# Patient Record
Sex: Female | Born: 1985 | Race: White | Hispanic: No | Marital: Married | Smoking: Never smoker
Health system: Southern US, Community
[De-identification: ages and names within clinical notes are randomized; demographics above are authoritative.]

## PROBLEM LIST (undated history)

## (undated) DIAGNOSIS — F419 Anxiety disorder, unspecified: Secondary | ICD-10-CM

## (undated) DIAGNOSIS — R011 Cardiac murmur, unspecified: Secondary | ICD-10-CM

## (undated) DIAGNOSIS — D759 Disease of blood and blood-forming organs, unspecified: Secondary | ICD-10-CM

## (undated) DIAGNOSIS — K219 Gastro-esophageal reflux disease without esophagitis: Secondary | ICD-10-CM

## (undated) DIAGNOSIS — O1410 Severe pre-eclampsia, unspecified trimester: Secondary | ICD-10-CM

## (undated) DIAGNOSIS — N649 Disorder of breast, unspecified: Secondary | ICD-10-CM

## (undated) DIAGNOSIS — M797 Fibromyalgia: Secondary | ICD-10-CM

## (undated) DIAGNOSIS — O99019 Anemia complicating pregnancy, unspecified trimester: Secondary | ICD-10-CM

## (undated) HISTORY — PX: BREAST SURGERY: SHX581

---

## 2012-09-28 ENCOUNTER — Other Ambulatory Visit: Payer: Self-pay

## 2012-10-06 ENCOUNTER — Ambulatory Visit (HOSPITAL_COMMUNITY)
Admission: RE | Admit: 2012-10-06 | Discharge: 2012-10-06 | Disposition: A | Discharge status: Home / Self Care | Payer: BC Managed Care – PPO | Source: Ambulatory Visit | Attending: Obstetrics and Gynecology | Admitting: Obstetrics and Gynecology

## 2012-10-06 ENCOUNTER — Other Ambulatory Visit (HOSPITAL_COMMUNITY): Payer: Self-pay | Admitting: Obstetrics and Gynecology

## 2012-10-06 ENCOUNTER — Ambulatory Visit (HOSPITAL_COMMUNITY)
Admission: RE | Admit: 2012-10-06 | Discharge: 2012-10-06 | Disposition: A | Discharge status: Home / Self Care | Payer: BC Managed Care – PPO | Source: Ambulatory Visit

## 2012-10-06 ENCOUNTER — Other Ambulatory Visit (HOSPITAL_COMMUNITY): Payer: Self-pay | Admitting: Obstetrics & Gynecology

## 2012-10-06 ENCOUNTER — Ambulatory Visit (HOSPITAL_COMMUNITY): Payer: BC Managed Care – PPO

## 2012-10-06 ENCOUNTER — Other Ambulatory Visit: Payer: Self-pay

## 2012-10-06 ENCOUNTER — Encounter (HOSPITAL_COMMUNITY): Payer: Self-pay

## 2012-10-06 DIAGNOSIS — O2690 Pregnancy related conditions, unspecified, unspecified trimester: Secondary | ICD-10-CM

## 2012-10-06 DIAGNOSIS — O289 Unspecified abnormal findings on antenatal screening of mother: Secondary | ICD-10-CM | POA: Insufficient documentation

## 2012-10-06 DIAGNOSIS — O30009 Twin pregnancy, unspecified number of placenta and unspecified number of amniotic sacs, unspecified trimester: Secondary | ICD-10-CM | POA: Insufficient documentation

## 2012-10-06 DIAGNOSIS — O358XX0 Maternal care for other (suspected) fetal abnormality and damage, not applicable or unspecified: Secondary | ICD-10-CM | POA: Insufficient documentation

## 2012-10-06 DIAGNOSIS — O30049 Twin pregnancy, dichorionic/diamniotic, unspecified trimester: Secondary | ICD-10-CM | POA: Insufficient documentation

## 2012-10-06 NOTE — Progress Notes (Signed)
Genetic Counseling  Visit Summary Note  Appointment Date: 10/06/2012 Referred By: Earl Gala, CNM  Date of Birth: June 09, 1986  Pregnancy history: G1P0000 Estimated Date of Delivery: 04/13/13 Estimated Gestational Age: [redacted]w[redacted]d  I met with Sandra Tran and her husband, Sandra Tran, for genetic counseling because of an increased risk for fetal Down syndrome and trisomies 13/18 based on First trimester screening through [facility].  Mr. Zarling mother and sister attended the genetic counseling appointment today.  We reviewed Sandra Tran's First trimester screening result and the associated risks.  They were counseled that the screening result for Twin A was positive for Down syndrome (1 in 50 risk) and trisomies 13/18 (1 in 22); the screening result was within normal limits (<1 in 10,000 risk for Down syndrome and trisomies 13/18) for twin B.  We reviewed chromosomes, nondisjunction, and the common features and variable prognosis of Down syndrome and the associated poor prognosis of trisomies 13/18.  They were counseled that the screening result is calculated based on both biochemical and ultrasound markers.  While it is difficult to determine how much of each biochemical substance (protein) is produced by each baby, the ultrasound findings are specific to each fetus, thus a specific risk for aneuploidy can be provided for twin A and twin B.  We discussed that the nuchal translucency (NT) for twin A measured 4.6 mm on the date of screening, which is greater than the 99th percentile for gestational age.  In addition, a fetal nasal bone was not able to be assessed at that time.  We discussed that not being able to assess the nasal bone means that it's presence or absence is not calculated into the risk equation.  This is different than the finding of an absent nasal bone, which significantly increases the likelihood of fetal aneuploidy.  They understand that First trimester screening is not  diagnostic for fetal aneuploidy but provides a pregnancy specific risk assessment.  We discussed that the fetal NT refers to a fluid filled space between the skin and soft tissues behind the cervical spine.  This space is traditionally measurable between 11 and 13.[redacted] weeks gestation and is considered enlarged when greater than the 95th percentile for gestational age.  This couple was counseled regarding the various common etiologies for an enlarged NT including: aneuploidy, single gene conditions, cardiac or great vessel abnormalities, lymphatic system failure, decreased fetal movement, and fetal anemia. They understand that the risks for aneuploidy calculated by First trimester screening incorporate the patient's age related risk(s), the size of each fetal NT, nasal bone, and biochemistry.  Regarding risks for a congenital heart defect, we discussed that an NT of 4.6 mm is associated with an approximate 7% risk for a fetal heart defect.  We also discussed single gene conditions and that these conditions are not routinely tested for prenatally unless ultrasound findings or family history significantly increase the suspicion of a specific single gene disorder.  We briefly reviewed common inheritance patterns (dominant, recessive, and X-linked) as well as the associated risks of recurrence.     They were then counseled regarding their options of noninvasive prenatal testing (NIPT), 2nd trimester detailed ultrasound, fetal echocardiogram, CVS and amniocentesis.   We reviewed the benefits, limitations, and risks of these options.  After thoughtful consideration, they requested an ultrasound today to reassess the fetal NT measurements.  A limited ultrasound was performed.  The report will be documented separately.  Twin A had an NT of 2.1 mm with a present  nasal bone.  Twin B had an NT of 2.7 mm with a present nasal bone.  We discussed that although the measurements obtained today differed from those obtained a week  ago, it is not advisable to modify the First trimester screening results.  We discussed that this is because it is possible that the NT measurement for twin A is less because it is in the process of resolving.  In addition, we cannot guarantee that we are labeling Twin A and Twin B the same as prior ultrasounds at the referring physician's office.  They were counseled that NT measurements typically increase with CRL, thus some change is expected in a week.  They elected to proceed with NIPT.  We reviewed that NIPT analyzes cell free fetal DNA found in the maternal circulation. This test is not diagnostic for chromosome conditions, but can provide information regarding the presence or absence of extra fetal DNA for chromosomes 13, 18, 21, X, and Y, and missing fetal DNA for chromosome X (Turner syndrome) and Y. Thus, it would not identify or rule out all genetic conditions. The reported detection rate is greater than 99% for Trisomy 21, greater than 98% for Trisomy 18, and is approximately 80% (8 out of 10) for Trisomy 13. The false positive rate is reported to be less than 0.1% for any of these conditions. We discussed that X and Y chromosome analysis is not available for a twin pregnancy.  They understand that this testing does not provide a definitive diagnosis; however, they wish to pursue NIPT in order to further personalize the risk for aneuploidy and will consider amniocentesis pending these results.  They declined CVS today.  They also expressed interest in a detailed ultrasound at 18 weeks and fetal echocardiogram.  They were counseled that the fetal prognosis depends on the underlying etiology of the enlarged NT and further anticipatory guidance can be provided if a diagnosis is discovered.  They understand that the legal limit for TAB in [place] is [redacted] weeks gestation.  Both family histories were reviewed and found to be noncontributory for birth defects, mental retardation, and known genetic conditions.   Without further information regarding the provided family history, an accurate genetic risk cannot be calculated. Further genetic counseling is warranted if more information is obtained.  Mrs. Mireles denied exposure to environmental toxins and chemical agents. She denied the use of alcohol, tobacco or street drugs. She denied significant viral illnesses during the course of her pregnancy. Her medical and surgical histories were noncontributory.   I counseled this couple for approximately 60 minutes regarding the above risks and available options.     Donald Prose, MS Certified Genetic Counselor

## 2012-10-06 NOTE — Progress Notes (Signed)
Sandra Tran  was seen today for an ultrasound appointment.  See full report in [facility].  Comments: [name] was seen today due to thickened NT of 4 mm on twin A last week.  See separate note from Genetics couseling.  After counseling, the patient elected to undergo NIPT (H[facility]ony test).  In the event that cell free fetal DNA returns high risk for aneuploidy, the couple plans on amniocentesis at 15-16 weeks.  Impression: DC/DA twin gestation with best dates of 13 0/7 weeks A thick separating membrane was noted with a twin peak sign.  Twin A:  NT 2.1 mm, nasal bone visualized  Twin B: NT  2.7 mm, nasal bone visualized  Recommendations: See comments above. Recommend follow-up ultrasound examination at approximately 18 weeks for detailed anatomy.  Based on results of Harmony test and ultrasound findings at that time, may proceed with fetal echo due to thickened nuchal fold on previous ultrasound.  Alpha Gula, MD

## 2012-10-16 ENCOUNTER — Telehealth (HOSPITAL_COMMUNITY): Payer: Self-pay | Admitting: MS"

## 2012-10-16 NOTE — Telephone Encounter (Signed)
Called Sandra Tran to discuss her Harmony, cell free fetal DNA testing. Testing was offered because of screen positive Down syndrome, Trisomy 18 and 13 risks from first trimester screening. We reviewed that these are within normal limits, showing a less than 1 in 10,000 risk for trisomies 21, 18 and 13. We reviewed that this testing identifies > 99% of pregnancies with trisomy 21, >98% of pregnancies with trisomy 58, and >80% with trisomy 47; the false positive rate is <0.1% for all conditions. However, we reviewed that detection rate is lower in a twin pregnancy. Testing is not available for X and Y chromosome analysis in a twin gestation. She understands that this testing does not identify all genetic conditions. All questions were answered to her satisfaction, she was encouraged to call with additional questions or concerns.  Quinn Plowman, MS Certified Genetic Counselor 10/16/2012 3:18 PM

## 2012-11-11 ENCOUNTER — Other Ambulatory Visit (HOSPITAL_COMMUNITY): Payer: Self-pay | Admitting: Obstetrics and Gynecology

## 2012-11-11 DIAGNOSIS — O30009 Twin pregnancy, unspecified number of placenta and unspecified number of amniotic sacs, unspecified trimester: Secondary | ICD-10-CM

## 2012-11-13 ENCOUNTER — Ambulatory Visit (HOSPITAL_COMMUNITY)
Admission: RE | Admit: 2012-11-13 | Discharge: 2012-11-13 | Disposition: A | Discharge status: Home / Self Care | Payer: BC Managed Care – PPO | Source: Ambulatory Visit | Attending: Obstetrics and Gynecology | Admitting: Obstetrics and Gynecology

## 2012-11-13 ENCOUNTER — Encounter (HOSPITAL_COMMUNITY): Payer: Self-pay

## 2012-11-13 VITALS — BP 113/71 | HR 106 | Wt 182.0 lb

## 2012-11-13 DIAGNOSIS — O289 Unspecified abnormal findings on antenatal screening of mother: Secondary | ICD-10-CM

## 2012-11-13 DIAGNOSIS — Z3689 Encounter for other specified antenatal screening: Secondary | ICD-10-CM | POA: Insufficient documentation

## 2012-11-13 DIAGNOSIS — O30009 Twin pregnancy, unspecified number of placenta and unspecified number of amniotic sacs, unspecified trimester: Secondary | ICD-10-CM | POA: Insufficient documentation

## 2012-11-13 DIAGNOSIS — O30049 Twin pregnancy, dichorionic/diamniotic, unspecified trimester: Secondary | ICD-10-CM | POA: Insufficient documentation

## 2012-11-13 IMAGING — US US OB DETAIL+14 WK
1 series · 15 of 28 positions shown · non-contrast
Comparison: none

OBSTETRICS REPORT
                      (Signed Final [DATE] [DATE])

Service(s) Provided
 US OB DETAIL + 14 WK                                  76811.0
 US OB DETAIL ADDL GEST + 14 WK                        76811.1
Indications
 Twin gestation, Di-Di
Fetal Evaluation (Fetus A)
 Num Of Fetuses:    2
 Fetal Heart Rate:  153                          bpm
 Cardiac Activity:  Observed
 Fetal Lie:         Right Fetus
 Presentation:      Breech
 Placenta:          Posterior, above cervical
                    os
 P. Cord            Visualized, central
 Insertion:
 Membrane Desc:     Dividing
                    Membrane seen
                    - Dichorionic.
 Amniotic Fluid
 AFI FV:      Subjectively within normal limits
                                             Larg Pckt:     5.2  cm
Biometry (Fetus A)
 BPD:     42.8  mm     G. Age:  19w 0d                CI:         78.7   70 - 86
 OFD:     54.4  mm                                    FL/HC:      16.1   15.8 -
                                                                         18
 HC:     156.7  mm     G. Age:  18w 5d       50  %    HC/AC:      1.17   1.07 -
 AC:     134.4  mm     G. Age:  19w 0d       63  %    FL/BPD:
 FL:      25.2  mm     G. Age:  17w 5d       18  %    FL/AC:      18.8   20 - 24
 HUM:     26.1  mm     G. Age:  18w 1d       48  %
 CER:     18.9  mm     G. Age:  18w 3d       51  %
 NFT:        5  mm
 Est. FW:     236  gm      0 lb 8 oz     45  %     FW Discordancy      0 \ 6 %
Gestational Age (Fetus A)
 LMP:           18w 3d        Date:  [DATE]                 EDD:   [DATE]
 U/S Today:     18w 4d                                        EDD:   [DATE]
 Best:          18w 3d     Det. By:  LMP  ([DATE])          EDD:   [DATE]
Anatomy (Fetus A)
 Cranium:          Appears normal         Aortic Arch:      Appears normal
 Fetal Cavum:      Appears normal         Ductal Arch:      Appears normal
 Ventricles:       Appears normal         Diaphragm:        Appears normal
 Choroid Plexus:   Appears normal         Stomach:          Appears normal, left
                                                            sided
 Cerebellum:       Appears normal         Abdomen:          Appears normal
 Posterior Fossa:  Appears normal         Abdominal Wall:   Appears nml (cord
                                                            insert, abd wall)
 Nuchal Fold:      Appears normal         Cord Vessels:     Appears normal (3
                                                            vessel cord)
 Face:             Appears normal         Kidneys:          Appear normal
                   (orbits and profile)
 Lips:             Appears normal         Bladder:          Appears normal
 Palate:           Not well visualized    Spine:            Appears normal
 Heart:            Not well visualized    Lower             Appears normal
                                          Extremities:
 RVOT:             Appears normal         Upper             Appears normal
 LVOT:             Not well visualized
 Other:  Fetus appears to be a male. Heels and 5th digit appear normal.
Targeted Anatomy (Fetus A)
 Fetal Central Nervous System
 Cisterna Magna:
Fetal Evaluation (Fetus B)
 Fetal Heart Rate:  155                          bpm
 Fetal Lie:         Left Fetus
 Presentation:      Cephalic
 Placenta:          Anterior, above cervical os
                                             Larg Pckt:     5.1  cm
Biometry (Fetus B)
 BPD:     38.1  mm     G. Age:  17w 4d                CI:         71.8   70 - 86
 OFD:     53.1  mm                                    FL/HC:      17.8   15.8 -
 HC:     146.8  mm     G. Age:  17w 6d       17  %    HC/AC:      1.17   1.07 -
 AC:     125.8  mm     G. Age:  18w 1d       39  %    FL/BPD:
 FL:      26.2  mm     G. Age:  18w 0d       28  %    FL/AC:      20.8   20 - 24
 HUM:     25.1  mm     G. Age:  17w 6d       37  %
 Est. FW:     221  gm      0 lb 8 oz     38  %     FW Discordancy         6  %
Gestational Age (Fetus B)
 U/S Today:     17w 6d                                        EDD:   [DATE]
Anatomy (Fetus B)
 Fetal Cavum:      Not well visualized    Ductal Arch:      Not well visualized
 Cerebellum:       Not well visualized    Abdomen:          Appears normal
 Posterior Fossa:  Not well visualized    Abdominal Wall:   Appears nml (cord
 Nuchal Fold:      Not well visualized    Cord Vessels:     Appears normal (3
 Face:             Profile appears        Kidneys:          Appear normal
                   normal
 Lips:             Not well visualized    Bladder:          Appears normal
 Palate:           Not well visualized    Spine:            Not well visualized
 RVOT:             Not well visualized    Upper             Appears normal
 Other:  Fetus appears to be a female. Heels and 5th digit appear normal.
Cervix Uterus Adnexa
 Cervical Length:    3.7      cm
 Cervix:       Normal appearance by transabdominal scan.
 Left Ovary:    Within normal limits.
 Right Ovary:   Within normal limits.
Impression
INDICATION: 27 yr old G1P0 at [BX] with
 dichorionic/diamniotic twin gestation and previous finding of
 enlarged nuchal translucency in twin A for fetal anatomic
 survey.

[Series 1: us ob detail+14 wk · 0.21mm/px · 15 of 178 slices shown]
[im 1/178]
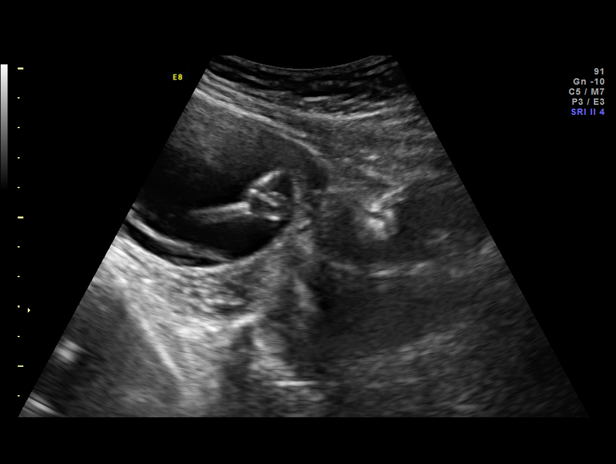
[im 14/178]
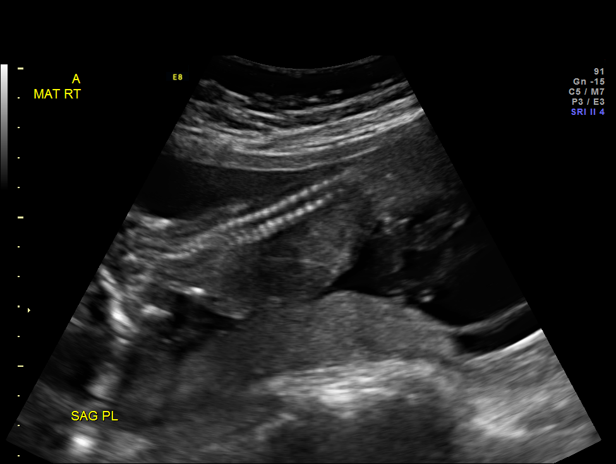
[im 27/178]
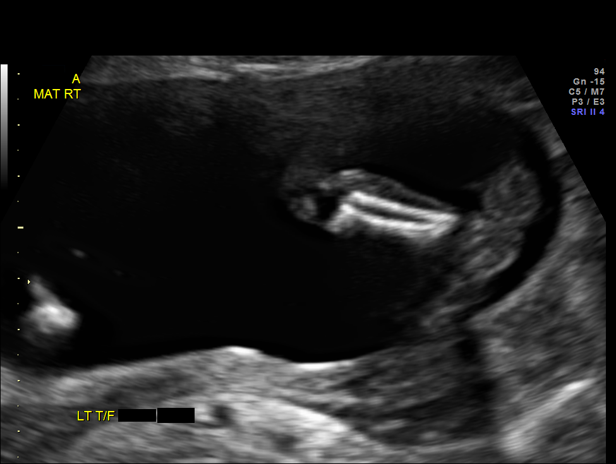
[im 40/178]
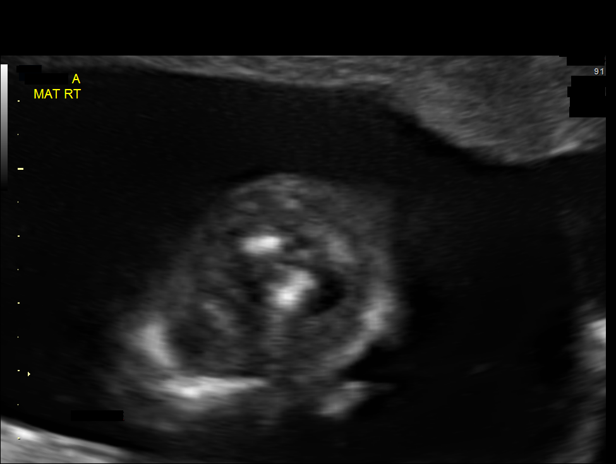
[im 53/178]
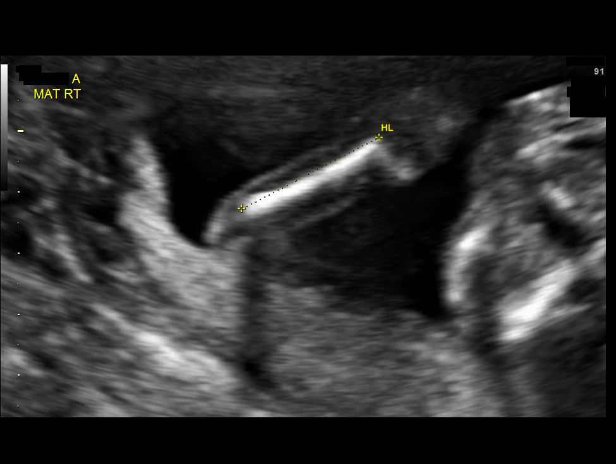
[im 66/178]
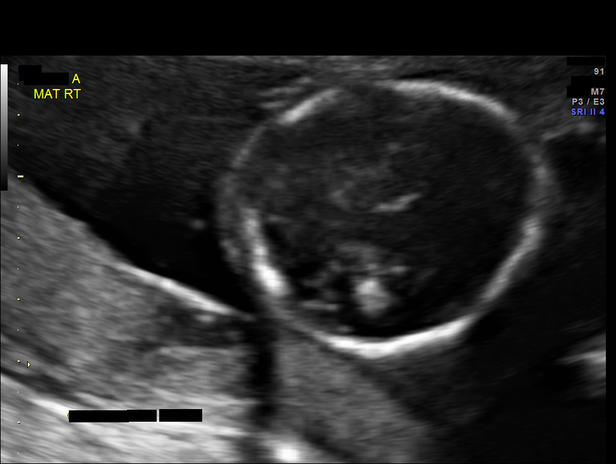
[im 79/178]
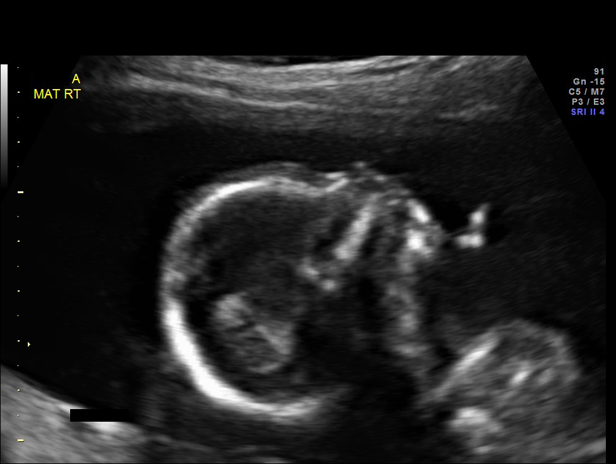
[im 92/178]
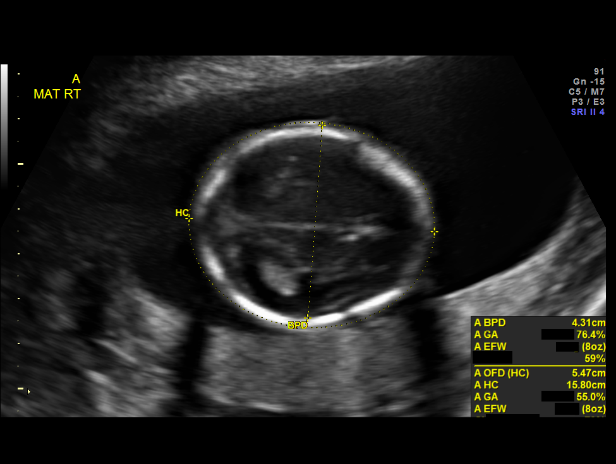
[im 99/178]
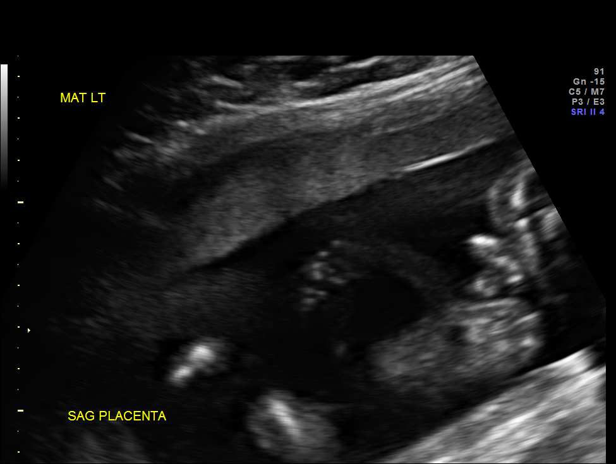
[im 112/178]
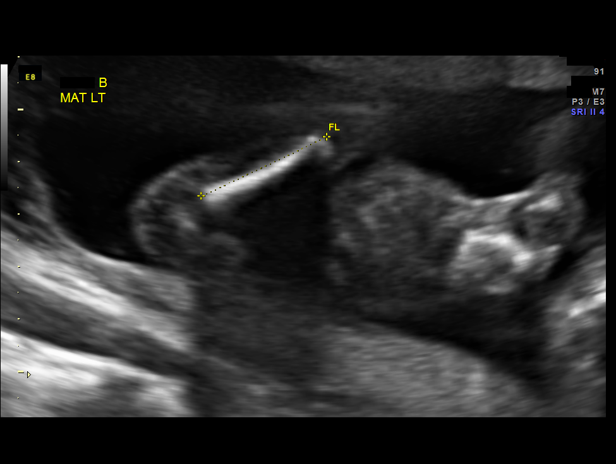
[im 125/178]
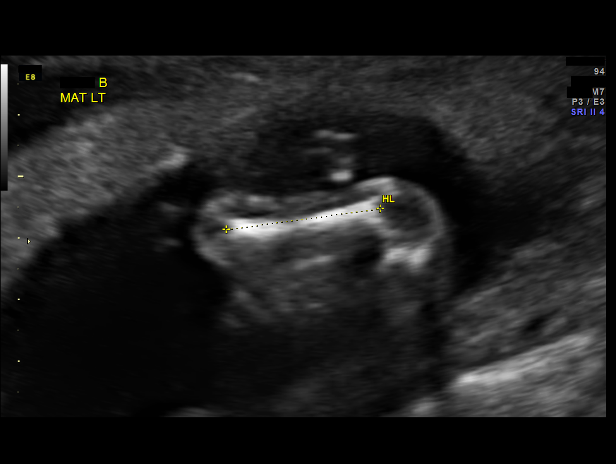
[im 138/178]
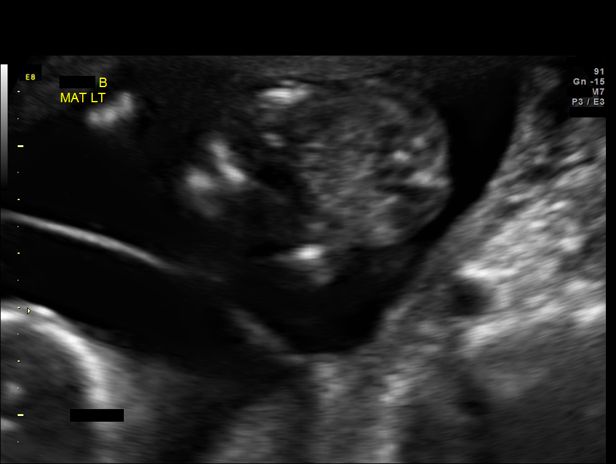
[im 151/178]
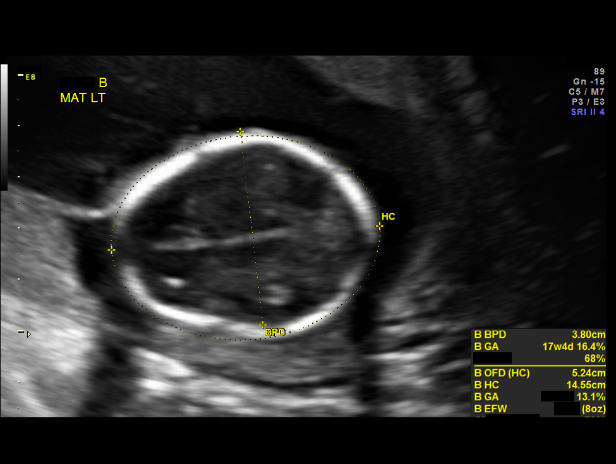
[im 164/178]
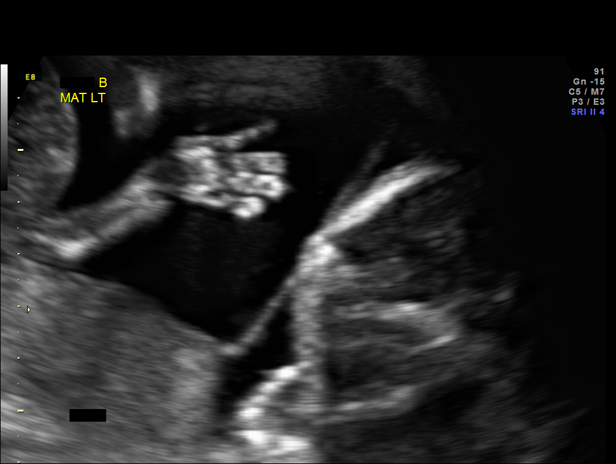
[im 178/178]
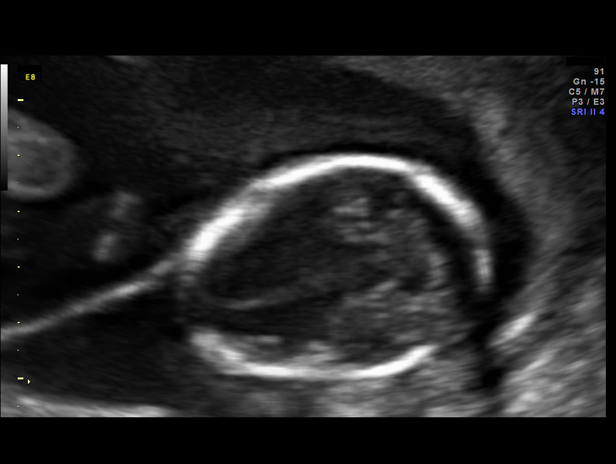

[15 of 28 positions shown; findings below may reference images not displayed]

FINDINGS: 1. Dichorionic/diamniotic twin gestation; the dividing
 membrane is seen.
 2. Fetal biometry for both fetuses is consistent with dating.
 3. Twin A with a posterior placenta; twin B with an anterior
 placenta; there is no evidence of previa.
 4. Normal amniotic fluid volume for both fetuses.
 5. Normal transabdominal cervical length.
 6. The views of the palate and heart are limited in twin A.
 7. The views of the posterior fossa, cavum, heart, orbits, and
 spine are limited in twin B.
 8. The remainder of the limited anatomy survey for both
 fetuses is normal.
Recommendations

 1. Twin pregnancy:
 - previously counseled
 - recommend fetal growth every 4 weeks
 - recommend delivery at 38 weeks or sooner if clinically
 indicated
 - recommend close surveillance for the development of
 signs/symptoms of preterm labor/PPROM
 2. Limited anatomy survey:
 - recommend follow up in 3 weeks for fetal growth and
 complete anatomic survey
 3. Previous finding of enlarged nuchal translucency in twin A
 with increased risk of aneuploidy on first trimester screen:
 - previously counseled
 - had normal Harm[facility] screen
 - recommend fetal echocardiogram at 22 weeks

## 2012-11-13 NOTE — Progress Notes (Signed)
[facility] ultrasound  Indication: 27 yr old G1P0 at [redacted]w[redacted]d with dichorionic/diamniotic twin gestation and previous finding of enlarged nuchal translucency in twin A for fetal anatomic survey.  Findings: 1. Dichorionic/diamniotic twin gestation; the dividing membrane is seen. 2. Fetal biometry for both fetuses is consistent with dating. 3. Twin A with a posterior placenta; twin B with an anterior placenta; there is no evidence of previa. 4. Normal amniotic fluid volume for both fetuses. 5. Normal transabdominal cervical length. 6. The views of the palate and heart are limited in twin A. 7. The views of the posterior fossa, cavum, heart, orbits, and spine are limited in twin B. 8. The remainder of the limited anatomy survey for both fetuses is normal.  Recommendations: 1. Twin pregnancy: - previously counseled - recommend fetal growth every 4 weeks - recommend delivery at 38 weeks or sooner if clinically indicated - recommend close surveillance for the development of signs/symptoms of preterm labor/PPROM 2. Limited anatomy survey: - recommend follow up in 3 weeks for fetal growth and complete anatomic survey 3. Previous finding of enlarged nuchal translucency in twin A with increased risk of aneuploidy on first trimester screen: - previously counseled - had normal Harmony screen - recommend fetal echocardiogram at 22 weeks  Eulis Foster, MD

## 2012-12-04 ENCOUNTER — Encounter: Payer: Self-pay | Admitting: Obstetrics and Gynecology

## 2012-12-04 ENCOUNTER — Ambulatory Visit (HOSPITAL_COMMUNITY)
Admission: RE | Admit: 2012-12-04 | Discharge: 2012-12-04 | Disposition: A | Discharge status: Home / Self Care | Payer: BC Managed Care – PPO | Source: Ambulatory Visit | Attending: Obstetrics and Gynecology | Admitting: Obstetrics and Gynecology

## 2012-12-04 VITALS — BP 125/74 | HR 110 | Wt 193.5 lb

## 2012-12-04 DIAGNOSIS — O289 Unspecified abnormal findings on antenatal screening of mother: Secondary | ICD-10-CM

## 2012-12-04 DIAGNOSIS — Z3689 Encounter for other specified antenatal screening: Secondary | ICD-10-CM | POA: Insufficient documentation

## 2012-12-04 DIAGNOSIS — O30049 Twin pregnancy, dichorionic/diamniotic, unspecified trimester: Secondary | ICD-10-CM

## 2012-12-04 DIAGNOSIS — O30009 Twin pregnancy, unspecified number of placenta and unspecified number of amniotic sacs, unspecified trimester: Secondary | ICD-10-CM | POA: Insufficient documentation

## 2012-12-04 NOTE — Progress Notes (Signed)
Sandra Tran  was seen today for an ultrasound appointment.  See full report in [facility].  Impression: DC/DA twin gestation with best dates of 21 3/7 weeks Iterval growth is appropriate and concordant.  Twin A: Cephalic, maternal right, posterior placenta Normal interval anatomy Normal amniotic fluid volume  Twin B: Breech, maternal left, anterior placenta Normal fetal anatomy.  Somewhat limited views of the fetal heart obtained Normal amniotic fluid volume  Patient has fetal echo scheduled this afternoon (due to thickend NT on previous visit).  NIPT - low risk for aneuploidy.  Recommendations: Recommend follow-up ultrasound examination in 4 weeks for interval growth  Alpha Gula, MD

## 2013-01-04 ENCOUNTER — Ambulatory Visit (HOSPITAL_COMMUNITY)
Admission: RE | Admit: 2013-01-04 | Discharge: 2013-01-04 | Disposition: A | Discharge status: Home / Self Care | Payer: BC Managed Care – PPO | Source: Ambulatory Visit | Attending: Maternal and Fetal Medicine | Admitting: Maternal and Fetal Medicine

## 2013-01-04 DIAGNOSIS — O30049 Twin pregnancy, dichorionic/diamniotic, unspecified trimester: Secondary | ICD-10-CM

## 2013-01-04 DIAGNOSIS — O289 Unspecified abnormal findings on antenatal screening of mother: Secondary | ICD-10-CM

## 2013-01-04 DIAGNOSIS — O30009 Twin pregnancy, unspecified number of placenta and unspecified number of amniotic sacs, unspecified trimester: Secondary | ICD-10-CM | POA: Insufficient documentation

## 2013-01-04 DIAGNOSIS — Z3689 Encounter for other specified antenatal screening: Secondary | ICD-10-CM | POA: Insufficient documentation

## 2013-01-04 NOTE — Progress Notes (Signed)
[facility] ultrasound  Indication: 27 yr old G1P0 at 12w6dd with dichorionic/diamniotic twin gestation and previous finding of enlarged nuchal translucency in twin A for follow up fetal growth.  Findings: 1. Dichorionic/diamniotic twin gestation; the dividing membrane is seen. 2. Estimated fetal weight for twin A is in the 64th%; twin B is in the 55th%; the fetuses are concordant. 3. Twin A with a posterior placenta; twin B with an anterior placenta; there is no evidence of previa. 4. Normal amniotic fluid volume for both fetuses. 5. The limited anatomy survey for both fetuses is normal.  Recommendations: 1. Twin pregnancy: - previously counseled - recommend fetal growth every 4 weeks - recommend delivery at 38 weeks or sooner if clinically indicated - recommend close surveillance for the development of signs/symptoms of preterm labor/PPROM 2.  Previous finding of enlarged nuchal translucency in twin A with increased risk of aneuploidy on first trimester screen: - previously counseled - had normal [facility] screen - had normal fetal echocardiograms  Eulis Foster, MD

## 2013-01-18 ENCOUNTER — Other Ambulatory Visit: Payer: Self-pay

## 2013-02-11 ENCOUNTER — Encounter (HOSPITAL_COMMUNITY): Payer: Self-pay | Admitting: General Practice

## 2013-02-11 ENCOUNTER — Inpatient Hospital Stay (HOSPITAL_COMMUNITY)
Admission: AD | Admit: 2013-02-11 | Discharge: 2013-02-17 | DRG: 650 | Disposition: A | Discharge status: Home / Self Care | Payer: BC Managed Care – PPO | Source: Ambulatory Visit | Attending: Obstetrics and Gynecology | Admitting: Obstetrics and Gynecology

## 2013-02-11 DIAGNOSIS — O1410 Severe pre-eclampsia, unspecified trimester: Secondary | ICD-10-CM | POA: Diagnosis present

## 2013-02-11 DIAGNOSIS — F411 Generalized anxiety disorder: Secondary | ICD-10-CM | POA: Diagnosis present

## 2013-02-11 DIAGNOSIS — O1413 Severe pre-eclampsia, third trimester: Secondary | ICD-10-CM

## 2013-02-11 DIAGNOSIS — O30049 Twin pregnancy, dichorionic/diamniotic, unspecified trimester: Secondary | ICD-10-CM | POA: Diagnosis present

## 2013-02-11 DIAGNOSIS — O99019 Anemia complicating pregnancy, unspecified trimester: Secondary | ICD-10-CM | POA: Diagnosis present

## 2013-02-11 DIAGNOSIS — O1414 Severe pre-eclampsia complicating childbirth: Principal | ICD-10-CM | POA: Diagnosis present

## 2013-02-11 DIAGNOSIS — D696 Thrombocytopenia, unspecified: Secondary | ICD-10-CM | POA: Diagnosis present

## 2013-02-11 DIAGNOSIS — O99344 Other mental disorders complicating childbirth: Secondary | ICD-10-CM | POA: Diagnosis present

## 2013-02-11 DIAGNOSIS — O99013 Anemia complicating pregnancy, third trimester: Secondary | ICD-10-CM

## 2013-02-11 DIAGNOSIS — D500 Iron deficiency anemia secondary to blood loss (chronic): Secondary | ICD-10-CM | POA: Diagnosis present

## 2013-02-11 DIAGNOSIS — D689 Coagulation defect, unspecified: Secondary | ICD-10-CM | POA: Diagnosis present

## 2013-02-11 DIAGNOSIS — O9902 Anemia complicating childbirth: Secondary | ICD-10-CM | POA: Diagnosis present

## 2013-02-11 DIAGNOSIS — O30009 Twin pregnancy, unspecified number of placenta and unspecified number of amniotic sacs, unspecified trimester: Secondary | ICD-10-CM | POA: Diagnosis present

## 2013-02-11 HISTORY — DX: Cardiac murmur, unspecified: R01.1

## 2013-02-11 HISTORY — DX: Anxiety disorder, unspecified: F41.9

## 2013-02-11 HISTORY — DX: Disorder of breast, unspecified: N64.9

## 2013-02-11 HISTORY — DX: Fibromyalgia: M79.7

## 2013-02-11 HISTORY — DX: Gastro-esophageal reflux disease without esophagitis: K21.9

## 2013-02-11 HISTORY — DX: Anemia complicating pregnancy, unspecified trimester: O99.019

## 2013-02-11 HISTORY — DX: Severe pre-eclampsia, unspecified trimester: O14.10

## 2013-02-11 HISTORY — DX: Disease of blood and blood-forming organs, unspecified: D75.9

## 2013-02-11 LAB — CBC
HCT: 27.8 % — ABNORMAL LOW (ref 36.0–46.0)
Hemoglobin: 8.7 g/dL — ABNORMAL LOW (ref 12.0–15.0)
MCH: 23.8 pg — ABNORMAL LOW (ref 26.0–34.0)
MCH: 23.4 pg — ABNORMAL LOW (ref 26.0–34.0)
MCV: 76 fL — ABNORMAL LOW (ref 78.0–100.0)
MCV: 76 fL — ABNORMAL LOW (ref 78.0–100.0)
Platelets: 69 10*3/uL — ABNORMAL LOW (ref 150–400)
RBC: 3.66 MIL/uL — ABNORMAL LOW (ref 3.87–5.11)
RBC: 3.67 MIL/uL — ABNORMAL LOW (ref 3.87–5.11)
WBC: 14 10*3/uL — ABNORMAL HIGH (ref 4.0–10.5)

## 2013-02-11 LAB — COMPREHENSIVE METABOLIC PANEL
ALT: 6 U/L (ref 0–35)
ALT: 7 U/L (ref 0–35)
AST: 15 U/L (ref 0–37)
Albumin: 2 g/dL — ABNORMAL LOW (ref 3.5–5.2)
Alkaline Phosphatase: 186 U/L — ABNORMAL HIGH (ref 39–117)
Alkaline Phosphatase: 199 U/L — ABNORMAL HIGH (ref 39–117)
CO2: 19 mEq/L (ref 19–32)
GFR calc Af Amer: 79 mL/min — ABNORMAL LOW (ref >90)
GFR calc non Af Amer: 68 mL/min — ABNORMAL LOW (ref >90)
Glucose, Bld: 154 mg/dL — ABNORMAL HIGH (ref 70–99)
Potassium: 3.7 mEq/L (ref 3.5–5.1)
Potassium: 3.7 mEq/L (ref 3.5–5.1)
Sodium: 135 mEq/L (ref 135–145)
Sodium: 134 mEq/L — ABNORMAL LOW (ref 135–145)
Total Protein: 5.5 g/dL — ABNORMAL LOW (ref 6.0–8.3)
Total Protein: 5.8 g/dL — ABNORMAL LOW (ref 6.0–8.3)

## 2013-02-11 MED ORDER — DULOXETINE HCL 60 MG PO CPEP
60.0000 mg | ORAL_CAPSULE | Freq: Every day | ORAL | Status: DC
  Administered 2013-02-11 – 2013-02-12 (×2): 60 mg via ORAL
  Filled 2013-02-11 (×3): qty 1

## 2013-02-11 MED ORDER — SODIUM CHLORIDE 0.9 % IJ SOLN
3.0000 mL | Freq: Two times a day (BID) | INTRAMUSCULAR | Status: DC
  Administered 2013-02-11 – 2013-02-12 (×3): 3 mL via INTRAVENOUS

## 2013-02-11 MED ORDER — BETAMETHASONE SOD PHOS & ACET 6 (3-3) MG/ML IJ SUSP
12.0000 mg | INTRAMUSCULAR | Status: AC
  Administered 2013-02-11 – 2013-02-12 (×2): 12 mg via INTRAMUSCULAR
  Filled 2013-02-11 (×2): qty 2

## 2013-02-11 MED ORDER — TRAZODONE HCL 50 MG PO TABS
50.0000 mg | ORAL_TABLET | Freq: Every day | ORAL | Status: DC
  Administered 2013-02-11 – 2013-02-12 (×2): 50 mg via ORAL
  Filled 2013-02-11 (×3): qty 1

## 2013-02-11 MED ORDER — PREGABALIN 50 MG PO CAPS
100.0000 mg | ORAL_CAPSULE | Freq: Every day | ORAL | Status: DC
  Administered 2013-02-11 – 2013-02-12 (×2): 100 mg via ORAL
  Filled 2013-02-11: qty 2
  Filled 2013-02-11 (×3): qty 1

## 2013-02-11 MED ORDER — PRENATAL MULTIVITAMIN CH
1.0000 | ORAL_TABLET | Freq: Every day | ORAL | Status: DC
  Filled 2013-02-11: qty 1

## 2013-02-11 MED ORDER — DOCUSATE SODIUM 100 MG PO CAPS
100.0000 mg | ORAL_CAPSULE | Freq: Every day | ORAL | Status: DC
  Filled 2013-02-11: qty 1

## 2013-02-11 MED ORDER — ZOLPIDEM TARTRATE 5 MG PO TABS: 5.0000 mg | ORAL_TABLET | Freq: Every evening | ORAL | Status: DC | PRN

## 2013-02-11 MED ORDER — PANTOPRAZOLE SODIUM 40 MG PO TBEC
40.0000 mg | DELAYED_RELEASE_TABLET | Freq: Two times a day (BID) | ORAL | Status: DC
  Administered 2013-02-11 – 2013-02-12 (×3): 40 mg via ORAL
  Filled 2013-02-11 (×6): qty 1

## 2013-02-11 MED ORDER — CALCIUM CARBONATE ANTACID 500 MG PO CHEW: 2.0000 | CHEWABLE_TABLET | ORAL | Status: DC | PRN

## 2013-02-11 NOTE — Anesthesia Preprocedure Evaluation (Addendum)
Anesthesia Evaluation  Patient identified by MRN, date of birth, ID band Patient awake    Reviewed: Allergy & Precautions, H&P , NPO status , Patient's Chart, lab work & pertinent test results  Airway Mallampati: II TM Distance: >3 FB Neck ROM: Full    Dental no notable dental hx. (+) Dental Advisory Given,    Pulmonary neg pulmonary ROS,  breath sounds clear to auscultation        Cardiovascular negative cardio ROS  Rhythm:Regular Rate:Normal     Neuro/Psych negative neurological ROS  negative psych ROS   GI/Hepatic Neg liver ROS, GERD-  Medicated,  Endo/Other  negative endocrine ROS  Renal/GU negative Renal ROS  negative genitourinary   Musculoskeletal negative musculoskeletal ROS (+)   Abdominal   Peds negative pediatric ROS (+)  Hematology negative hematology ROS (+) Blood dyscrasia ( thrombocytopenia), ,   Anesthesia Other Findings GERD Rx'd bid Thrombocytopenia  Reproductive/Obstetrics negative OB ROS                        Anesthesia Physical Anesthesia Plan  ASA: III  Anesthesia Plan: General   Post-op Pain Management:    Induction: Intravenous, Rapid sequence and Cricoid pressure planned  Airway Management Planned: Oral ETT and Video Laryngoscope Planned  Additional Equipment:   Intra-op Plan:   Post-operative Plan:   Informed Consent: I have reviewed the patients History and Physical, chart, labs and discussed the procedure including the risks, benefits and alternatives for the proposed anesthesia with the patient or authorized representative who has indicated his/her understanding and acceptance.   Dental Advisory Given and Dental advisory given  Plan Discussed with: CRNA and Surgeon  Anesthesia Plan Comments: (All patient's questions answered. Verbal consent for central line if necessary.   )       Anesthesia Quick Evaluation

## 2013-02-11 NOTE — H&P (Signed)
NAMEDARNETTE, LAMPRON NO.:  [id number]  MEDICAL RECORD NO.:  [id number]  LOCATION:  9157                          FACILITY:  [facility]  PHYSICIAN:  Lenoard Aden, M.D.DATE OF BIRTH:  Jul 02, 1986  DATE OF ADMISSION:  02/11/2013 DATE OF DISCHARGE:                             HISTORY & PHYSICAL   CHIEF COMPLAINT:  Weight gain, thrombocytopenia and questionable leakage of fluid.  HISTORY OF PRESENT ILLNESS:  A 27 year old white female, G1, P0, at 54 and 2/7th weeks gestation with dichorionic diamniotic twin gestation who presented to the office this morning with questionable fluid leakage, had negative evaluation with negative Nitrazine and normal ultrasound evaluation of amniotic fluid index.  She was noted to have 100 of protein in her urine with an 18-pound weight gain in the last 2 weeks. CBC was obtained revealing a platelet count of 74,000.  She is admitted at this point for further evaluation, observation, betamethasone, and fetal monitoring.    She has allergies to [facility] and OXYCONTIN.  MEDICATIONS:  Include Lyrica, Protonix, Diclegis, prenatal vitamins, Cymbalta, trazodone and an iron supplement.  SOCIAL HISTORY:  She is a nonsmoker, nondrinker.  She denies domestic or physical violence.  This is her first pregnancy.  SURGICAL HISTORY:  Remarkable for cystoscopy in 2011 and wisdom tooth extraction.  MEDICAL PROBLEMS:  Also to include anxiety, gastroesophageal reflux, and depression.  PHYSICAL EXAMINATION:  GENERAL:  She is a well-developed, well-nourished white female, in no acute distress. VITAL SIGNS:  Blood pressure 124/82. HEENT:  Normal. NECK:  Supple.  Full range of motion. LUNGS:  Clear. HEART:  Regular rhythm. ABDOMEN:  Soft, gravid, nontender.  Cervical exam is closed, 50%, vertex, -2. EXTREMITIES:  There are no cords.  Trace to 1+ edema noted, 2+ DTRs noted. NEUROLOGIC:  Nonfocal. SKIN:  Intact.  NSTs are reactive x  2.  IMPRESSION: 1. 31 and 2/7th week twin dichorionic diamniotic pregnancy. Concordant growth. 2. Thrombocytopenia/Hyperuricemia of questionable etiology- ? Early manifestation of PEC 3. Complicated Anxiety and depression 4-Gestational Anemia  PLAN:  Admit, repeat CBC, check CMP, continuous monitoring, Betamethasone series, 24-hour urine with strict I/Os, we will triage pending results. MFM consulted and agrees with plan of care.     Lenoard Aden, M.D.     RJT/MEDQ  D:  02/11/2013  T:  02/11/2013  Job:  [id number]

## 2013-02-11 NOTE — Progress Notes (Signed)
Patient ID: Gillie Fleites, female   DOB: 08/25/1986, 27 y.o.   MRN: [id number] HD#1  No complaints. No HA or epigastric pain. No N/V. Good FM. No contractions or bleeding.  BP 131/75  Pulse 97  Temp(Src) 97.6 F (36.4 C) (Oral)  Resp 18  Ht 5\' 7"  (1.702 m)  Wt 102.059 kg (225 lb)  BMI 35.23 kg/m2  LMP 07/07/2012  Lungs: CTA CV: RRR ABD: Gravid , NT No CVAT EXT: 2+ edema Neuro: nonfocal Skin: intact.  NST reactive x 2  31 2/7 weeks DIDI twin gestation Thrombocytopenia/Hyperuricemia Gestational anemia  Repeat labs pending BMZ series. Sono for EFW and formal MFM consult in am. 24 UTP Strict I/Os

## 2013-02-11 NOTE — Consult Note (Signed)
Asked by Dr Billy Coast to speak to Sandra Tran to discuss general outcome of preterm infants. Chart reviewed. Currently at 31 2/7 weeks, diamniotic dichorionic twins with concordant growth based on [place] 2 weeks ago. Acute problems are thrombocytopenia, hyperuricemia, and anemia. She has received her first dose of betamethasone this afternoon and is under observation.  I spoke to Sandra Tran in her room in the presence of their family members. I discussed stabilization/resuscitation after birth. I also talked about the most common complications of prematurity, namely RDS with varying resp support depending on degree of severity, immaturity of overall organ systems including immune system, GI, and CNS. I discussed the benefits of breast feeding in improving outcomes of these systems. Discussed [place] care, LOS, and requirements for d/c.  I discussed screening CUS to evaluate for IVH but should be less of a worry here based on GA unless the babies become very unstable during hosp course.   Thank you for letting us meet with this family and participate in their care.  Sandra Tran Q  Total consult time was 30 min. Face to face of 20 min.

## 2013-02-12 ENCOUNTER — Inpatient Hospital Stay (HOSPITAL_COMMUNITY): Payer: BC Managed Care – PPO

## 2013-02-12 ENCOUNTER — Ambulatory Visit (HOSPITAL_COMMUNITY): Payer: BC Managed Care – PPO

## 2013-02-12 ENCOUNTER — Encounter (HOSPITAL_COMMUNITY): Payer: Self-pay | Admitting: Certified Nurse Midwife

## 2013-02-12 DIAGNOSIS — O30049 Twin pregnancy, dichorionic/diamniotic, unspecified trimester: Secondary | ICD-10-CM | POA: Diagnosis present

## 2013-02-12 DIAGNOSIS — O99019 Anemia complicating pregnancy, unspecified trimester: Secondary | ICD-10-CM

## 2013-02-12 DIAGNOSIS — D696 Thrombocytopenia, unspecified: Secondary | ICD-10-CM | POA: Diagnosis present

## 2013-02-12 DIAGNOSIS — O99119 Other diseases of the blood and blood-forming organs and certain disorders involving the immune mechanism complicating pregnancy, unspecified trimester: Secondary | ICD-10-CM | POA: Diagnosis present

## 2013-02-12 HISTORY — DX: Anemia complicating pregnancy, unspecified trimester: O99.019

## 2013-02-12 LAB — COMPREHENSIVE METABOLIC PANEL
ALT: 6 U/L (ref 0–35)
AST: 16 U/L (ref 0–37)
Albumin: 2 g/dL — ABNORMAL LOW (ref 3.5–5.2)
Alkaline Phosphatase: 176 U/L — ABNORMAL HIGH (ref 39–117)
BUN: 13 mg/dL (ref 6–23)
CO2: 17 mEq/L — ABNORMAL LOW (ref 19–32)
Calcium: 8.8 mg/dL (ref 8.4–10.5)
Chloride: 103 mEq/L (ref 96–112)
Creatinine, Ser: 1.05 mg/dL (ref 0.50–1.10)
Creatinine, Ser: 1.2 mg/dL — ABNORMAL HIGH (ref 0.50–1.10)
GFR calc non Af Amer: 61 mL/min — ABNORMAL LOW (ref >90)
Potassium: 4 mEq/L (ref 3.5–5.1)
Potassium: 3.7 mEq/L (ref 3.5–5.1)
Sodium: 132 mEq/L — ABNORMAL LOW (ref 135–145)
Total Bilirubin: 0.3 mg/dL (ref 0.3–1.2)
Total Protein: 5.4 g/dL — ABNORMAL LOW (ref 6.0–8.3)

## 2013-02-12 LAB — CREATININE, URINE, 24 HOUR
Collection Interval-UCRE24: 24 hours
Urine Total Volume-UCRE24: 325 mL

## 2013-02-12 LAB — URIC ACID
Uric Acid, Serum: 9.8 mg/dL — ABNORMAL HIGH (ref 2.4–7.0)
Uric Acid, Serum: 10.4 mg/dL — ABNORMAL HIGH (ref 2.4–7.0)

## 2013-02-12 LAB — CBC
HCT: 25.5 % — ABNORMAL LOW (ref 36.0–46.0)
Hemoglobin: 7.5 g/dL — ABNORMAL LOW (ref 12.0–15.0)
MCH: 24.1 pg — ABNORMAL LOW (ref 26.0–34.0)
MCH: 23.8 pg — ABNORMAL LOW (ref 26.0–34.0)
MCHC: 31.8 g/dL (ref 30.0–36.0)
RBC: 3.15 MIL/uL — ABNORMAL LOW (ref 3.87–5.11)
RDW: 14.8 % (ref 11.5–15.5)
WBC: 20.4 10*3/uL — ABNORMAL HIGH (ref 4.0–10.5)

## 2013-02-12 LAB — PROTEIN, URINE, 24 HOUR: Urine Total Volume-UPROT: 325 mL

## 2013-02-12 LAB — LACTATE DEHYDROGENASE: LDH: 240 U/L (ref 94–250)

## 2013-02-12 MED ORDER — MAGNESIUM SULFATE 40 G IN LACTATED RINGERS - SIMPLE
1.0000 g/h | INTRAVENOUS | Status: DC
  Filled 2013-02-12: qty 500

## 2013-02-12 MED ORDER — DOXYLAMINE-PYRIDOXINE 10-10 MG PO TBEC
1.0000 | DELAYED_RELEASE_TABLET | Freq: Two times a day (BID) | ORAL | Status: DC
  Administered 2013-02-12 (×2): via ORAL

## 2013-02-12 MED ORDER — NON FORMULARY: 10.0000 mg | Freq: Three times a day (TID) | Status: DC

## 2013-02-12 MED ORDER — MAGNESIUM SULFATE BOLUS VIA INFUSION
4.0000 g | Freq: Once | INTRAVENOUS | Status: AC
  Administered 2013-02-12: 4 g via INTRAVENOUS
  Filled 2013-02-12: qty 500

## 2013-02-12 MED ORDER — MAGNESIUM SULFATE 40 G IN LACTATED RINGERS - SIMPLE: 1.0000 g/h | INTRAVENOUS | Status: DC

## 2013-02-12 MED ORDER — DOXYLAMINE-PYRIDOXINE 10-10 MG PO TBEC
2.0000 | DELAYED_RELEASE_TABLET | Freq: Every day | ORAL | Status: DC
  Administered 2013-02-12: 2 via ORAL

## 2013-02-12 NOTE — Progress Notes (Signed)
Admission nutrition screen triggered. Patients chart reviewed and assessed  for nutritional risk. PNR indicates steady weight gain. Patient is determined to be at low nutrition  risk.   Elisabeth Cara M.Odis Luster LDN Neonatal Nutrition Support Specialist Pager [phone]

## 2013-02-12 NOTE — Progress Notes (Signed)
Patient ID: Sandra Tran, female   DOB: 11/30/1985, 27 y.o.   MRN: [id number] No complaints Good FM No contractions No s/s PEC  BP 125/65  Pulse 88  Temp(Src) 97.5 F (36.4 C) (Oral)  Resp 24  Ht 5\' 7"  (1.702 m)  Wt 103.42 kg (228 lb)  BMI 35.7 kg/m2  LMP 07/07/2012  24UTP c/w , (adequate specimen-1.2gm Creatinine)- 205mg  protein  FHR reactive x 2 No contractions  31 + weeks DIDI twins Thrombocytopenia Hyperuricemia Oliguria  Strict I/Os Pending BPP, growth and MFM consult.

## 2013-02-12 NOTE — Consult Note (Signed)
MFM consult  Indication: 27 yr old G1P0 at [redacted]w[redacted]d with dichorionic/diamniotic twin gestation and previous finding of enlarged nuchal translucency in twin A for follow up fetal growth. Patient now with elevated blood pressures, oliguria, and thrombocytopenia.  Findings: 1. Dichorionic/diamniotic twin gestation; the dividing membrane is seen. 2. Estimated fetal weight for twin A is in the 53rd%; twin B is in the 64th%; the fetuses are concordant. 3. Twin A with a posterior placenta; twin B with an anterior placenta; there is no evidence of previa. 4. Normal amniotic fluid volume for both fetuses. 5. The limited anatomy survey for both fetuses is normal. 6. Both fetuses are in cephalic presentation. 7. Normal biophysical profiles of 8/8 for both fetuses. 8. Normal umbilical artery Doppler studies for both fetuses.  1. Twin pregnancy: - previously counseled 2.  Previous finding of enlarged nuchal translucency in twin A with increased risk of aneuploidy on first trimester screen: - previously counseled - had normal [place] screen - had normal fetal echocardiograms 3. Oliguria, elevated blood pressures, and thrombocytopenia: - patient reports has gained 18lbs in 2 weeks - reports no headache or abdominal pain, reports previous vision changes but none currently - had a 24 hour urine which showed total protein of 205mg  but the total volume was ; patient and nurse report no urine was missed therefore patient has oliguria - platelet count is 69,000 - creatinine is increased at 1.05 - uric acid is increased at 9.8 - LFTs are normal - patient has had 2 mild range blood pressures; the remainder of blood pressures are normal but patient reports elevated from her baseline - pulse ox on room air is 100% - given oliguria, elevated blood pressures, thrombocytopenia, and elevated creatinine patient meets criteria for severe preeclampsia - patient received 2nd dose of betamethasone this afternoon -  recommend delivery in the steroid window- tomorrow; does not qualify for prolonged expectant management given oliguria and thrombocytopenia but waiting for steroid window is acceptable if patient remains stable - would have a low threshold for delivering prior if blood pressures increase, urine output drops, or lab values worsen, or patinet shows neurologic symptoms of symptoms of pulmonary edema; nonreassuring fetal status; otherwise would recommend delivery tomorrow (in steroid window) - recommend start magnesium sulfate now and continue through 24 hours postpartum; would monitor closely for signs/symptoms of magnesium toxicity as is at increased risk given elevated creatinine and decreased urine output - discussed magnesium sulfate is for seizure prophylaxis as well as fetal neuroprotection - recommend follow lab studies every 6 hours - patient has opted for elective C section; if platelets are 50,000 or less recommend platelet transfusion with C section - recommend treat severe range blood pressures with IV labetalol - recommend continuous fetal monitoring until delivery  I spent 30 minutes in face to face consultation with the patient in addition to time spent on the ultrasound.  Discussed with patient and Dr. Ulysees Barns, MD

## 2013-02-12 NOTE — Progress Notes (Signed)
Patient ID: Kaelan Emami, female   DOB: 1985/10/17, 27 y.o.   MRN: [id number] Pt comfortable. No headaches or epigastric pain Good FM  BP 125/65  Pulse 79  Temp(Src) 97.5 F (36.4 C) (Oral)  Resp 18  Ht 5\' 7"  (1.702 m)  Wt 103.42 kg (228 lb)  BMI 35.7 kg/m2  SpO2 100%  LMP 07/07/2012  UO 425 today (improved) Lungs CTA CV RRR ABD: Gravid , NT DTRs 2+, no clonus, 2+ edema  FHR reactive x 2 BPP 8/8 UAD wnl  EFW A 53rd percentile and B 63rd percentile  Case discussed and see MFM consult Will rpt labs tonight and in AM Give MgSO4 for neuroprotection Cesarean section in AM Anesthesia and NICU aware Blood bank notified.  T&C for 4U and Plts available. Pt consented. Primary cesarean section in AM

## 2013-02-12 NOTE — Progress Notes (Signed)
Patient ID: Qunisha Bryk, female   DOB: January 09, 1986, 27 y.o.   MRN: [id number] HD#2  No complaints. No HA or epigastric pain. No N/V. Good FM. No contractions or bleeding.  BP 125/65  Pulse 88  Temp(Src) 98 F (36.7 C) (Oral)  Resp 18  Ht 5\' 7"  (1.702 m)  Wt 102.059 kg (225 lb)  BMI 35.23 kg/m2  LMP 07/07/2012   CBC    Component Value Date/Time   WBC 18.2* 02/12/2013 0605   RBC 3.36* 02/12/2013 0605   HGB 8.1* 02/12/2013 0605   HCT 25.5* 02/12/2013 0605   PLT 69* 02/12/2013 0605   MCV 75.9* 02/12/2013 0605   MCH 24.1* 02/12/2013 0605   MCHC 31.8 02/12/2013 0605   RDW 14.8 02/12/2013 0605   CMP     Component Value Date/Time   NA 134* 02/12/2013 0605   K 4.0 02/12/2013 0605   CL 105 02/12/2013 0605   CO2 18* 02/12/2013 0605   GLUCOSE 96 02/12/2013 0605   BUN 13 02/12/2013 0605   CREATININE 1.05 02/12/2013 0605   CALCIUM 8.8 02/12/2013 0605   PROT 5.4* 02/12/2013 0605   ALBUMIN 2.0* 02/12/2013 0605   AST 24 02/12/2013 0605   ALT 6 02/12/2013 0605   ALKPHOS 186* 02/12/2013 0605   BILITOT 0.4 02/12/2013 0605   GFRNONAA 72* 02/12/2013 0605   GFRAA 83* 02/12/2013 0605    Uric acid 9.8 LDH 240    Lungs: CTA CV: RRR ABD: Gravid , NT No CVAT EXT: 2+ edema Neuro: nonfocal Skin: intact.  NST reactive x 2  31 3/7 weeks DIDI twin gestation Thrombocytopenia/Hyperuricemia-labs stable. UO borderline.  ? Severe PEC Gestational anemia  Serial labs BMZ series. Sono for EFW/BPP  and formal MFM consult today. 24 UTP Strict I/Os NST q shift

## 2013-02-13 ENCOUNTER — Encounter (HOSPITAL_COMMUNITY)
Admission: AD | Disposition: A | Discharge status: Home / Self Care | Payer: Self-pay | Source: Ambulatory Visit | Attending: Obstetrics and Gynecology

## 2013-02-13 ENCOUNTER — Encounter (HOSPITAL_COMMUNITY): Payer: Self-pay

## 2013-02-13 ENCOUNTER — Encounter (HOSPITAL_COMMUNITY): Payer: Self-pay | Admitting: Anesthesiology

## 2013-02-13 ENCOUNTER — Inpatient Hospital Stay (HOSPITAL_COMMUNITY): Payer: BC Managed Care – PPO | Admitting: Anesthesiology

## 2013-02-13 DIAGNOSIS — O1410 Severe pre-eclampsia, unspecified trimester: Secondary | ICD-10-CM

## 2013-02-13 HISTORY — DX: Severe pre-eclampsia, unspecified trimester: O14.10

## 2013-02-13 LAB — COMPREHENSIVE METABOLIC PANEL
ALT: 8 U/L (ref 0–35)
Alkaline Phosphatase: 167 U/L — ABNORMAL HIGH (ref 39–117)
BUN: 14 mg/dL (ref 6–23)
BUN: 12 mg/dL (ref 6–23)
CO2: 14 mEq/L — ABNORMAL LOW (ref 19–32)
CO2: 18 mEq/L — ABNORMAL LOW (ref 19–32)
Calcium: 8.3 mg/dL — ABNORMAL LOW (ref 8.4–10.5)
Chloride: 104 mEq/L (ref 96–112)
GFR calc Af Amer: 88 mL/min — ABNORMAL LOW (ref >90)
GFR calc Af Amer: >90 mL/min (ref >90)
GFR calc non Af Amer: 76 mL/min — ABNORMAL LOW (ref >90)
Glucose, Bld: 90 mg/dL (ref 70–99)
Glucose, Bld: 118 mg/dL — ABNORMAL HIGH (ref 70–99)
Potassium: 4.3 mEq/L (ref 3.5–5.1)
Sodium: 134 mEq/L — ABNORMAL LOW (ref 135–145)
Total Bilirubin: 0.4 mg/dL (ref 0.3–1.2)
Total Protein: 5.6 g/dL — ABNORMAL LOW (ref 6.0–8.3)
Total Protein: 4.9 g/dL — ABNORMAL LOW (ref 6.0–8.3)

## 2013-02-13 LAB — CBC
HCT: 26 % — ABNORMAL LOW (ref 36.0–46.0)
HCT: 29.3 % — ABNORMAL LOW (ref 36.0–46.0)
Hemoglobin: 9.3 g/dL — ABNORMAL LOW (ref 12.0–15.0)
MCH: 23.8 pg — ABNORMAL LOW (ref 26.0–34.0)
MCH: 24.7 pg — ABNORMAL LOW (ref 26.0–34.0)
MCHC: 31.7 g/dL (ref 30.0–36.0)
MCV: 76.5 fL — ABNORMAL LOW (ref 78.0–100.0)
RBC: 3.4 MIL/uL — ABNORMAL LOW (ref 3.87–5.11)
RDW: 15.3 % (ref 11.5–15.5)
RDW: 15.1 % (ref 11.5–15.5)
WBC: 23.7 10*3/uL — ABNORMAL HIGH (ref 4.0–10.5)

## 2013-02-13 LAB — SAMPLE TO BLOOD BANK

## 2013-02-13 LAB — PREPARE RBC (CROSSMATCH)

## 2013-02-13 SURGERY — Surgical Case
Anesthesia: General | Site: Abdomen | Wound class: Clean Contaminated

## 2013-02-13 MED ORDER — LANOLIN HYDROUS EX OINT: 1.0000 "application " | TOPICAL_OINTMENT | CUTANEOUS | Status: DC | PRN

## 2013-02-13 MED ORDER — BUPIVACAINE HCL (PF) 0.25 % IJ SOLN
INTRAMUSCULAR | Status: DC | PRN
  Administered 2013-02-13: 30 mL

## 2013-02-13 MED ORDER — OXYTOCIN 10 UNIT/ML IJ SOLN
INTRAVENOUS | Status: DC
  Filled 2013-02-13: qty 500

## 2013-02-13 MED ORDER — ZOLPIDEM TARTRATE 5 MG PO TABS: 5.0000 mg | ORAL_TABLET | Freq: Every evening | ORAL | Status: DC | PRN

## 2013-02-13 MED ORDER — TETANUS-DIPHTH-ACELL PERTUSSIS 5-2.5-18.5 LF-MCG/0.5 IM SUSP
0.5000 mL | Freq: Once | INTRAMUSCULAR | Status: AC
  Administered 2013-02-14: 0.5 mL via INTRAMUSCULAR
  Filled 2013-02-13: qty 0.5

## 2013-02-13 MED ORDER — OXYTOCIN 10 UNIT/ML IJ SOLN
INTRAMUSCULAR | Status: AC
  Administered 2013-02-13: 21:00:00 via INTRAVENOUS
  Filled 2013-02-13: qty 500

## 2013-02-13 MED ORDER — OXYCODONE-ACETAMINOPHEN 5-325 MG PO TABS: 1.0000 | ORAL_TABLET | ORAL | Status: DC | PRN

## 2013-02-13 MED ORDER — CEFAZOLIN SODIUM-DEXTROSE 2-3 GM-% IV SOLR
INTRAVENOUS | Status: DC | PRN
  Administered 2013-02-13: 2 g via INTRAVENOUS

## 2013-02-13 MED ORDER — FENTANYL CITRATE 0.05 MG/ML IJ SOLN
INTRAMUSCULAR | Status: DC | PRN
  Administered 2013-02-13 (×2): 50 ug via INTRAVENOUS
  Administered 2013-02-13: 150 ug via INTRAVENOUS
  Administered 2013-02-13: 25 ug via INTRAVENOUS
  Administered 2013-02-13: 100 ug via INTRAVENOUS

## 2013-02-13 MED ORDER — IBUPROFEN 600 MG PO TABS: 600.0000 mg | ORAL_TABLET | Freq: Four times a day (QID) | ORAL | Status: DC

## 2013-02-13 MED ORDER — HYDROMORPHONE HCL PF 1 MG/ML IJ SOLN
INTRAMUSCULAR | Status: AC
  Filled 2013-02-13: qty 1

## 2013-02-13 MED ORDER — ONDANSETRON HCL 4 MG/2ML IJ SOLN
INTRAMUSCULAR | Status: AC
  Filled 2013-02-13: qty 2

## 2013-02-13 MED ORDER — MAGNESIUM SULFATE 40 G IN LACTATED RINGERS - SIMPLE
1.0000 g/h | INTRAVENOUS | Status: DC
  Administered 2013-02-13: 1 g/h via INTRAVENOUS
  Filled 2013-02-13: qty 500

## 2013-02-13 MED ORDER — OXYTOCIN 40 UNITS IN LACTATED RINGERS INFUSION - SIMPLE MED: 125.0000 mL/h | INTRAVENOUS | Status: DC

## 2013-02-13 MED ORDER — CARBOPROST TROMETHAMINE 250 MCG/ML IM SOLN
INTRAMUSCULAR | Status: AC
  Filled 2013-02-13: qty 1

## 2013-02-13 MED ORDER — SIMETHICONE 80 MG PO CHEW: 80.0000 mg | CHEWABLE_TABLET | ORAL | Status: DC | PRN

## 2013-02-13 MED ORDER — SODIUM CHLORIDE 0.9 % IV SOLN
INTRAVENOUS | Status: DC | PRN
  Administered 2013-02-13: 11:00:00 via INTRAVENOUS

## 2013-02-13 MED ORDER — FENTANYL CITRATE 0.05 MG/ML IJ SOLN
INTRAMUSCULAR | Status: AC
  Administered 2013-02-13: 0.5 ug via INTRAVENOUS
  Filled 2013-02-13: qty 2

## 2013-02-13 MED ORDER — ACETAMINOPHEN 10 MG/ML IV SOLN
INTRAVENOUS | Status: AC
  Administered 2013-02-13: 1000 mg via INTRAVENOUS
  Filled 2013-02-13: qty 100

## 2013-02-13 MED ORDER — DIBUCAINE 1 % RE OINT: 1.0000 "application " | TOPICAL_OINTMENT | RECTAL | Status: DC | PRN

## 2013-02-13 MED ORDER — LIDOCAINE HCL (CARDIAC) 20 MG/ML IV SOLN
INTRAVENOUS | Status: AC
  Filled 2013-02-13: qty 5

## 2013-02-13 MED ORDER — OXYTOCIN 40 UNITS IN LACTATED RINGERS INFUSION - SIMPLE MED: 62.5000 mL/h | INTRAVENOUS | Status: AC

## 2013-02-13 MED ORDER — SUCCINYLCHOLINE CHLORIDE 20 MG/ML IJ SOLN
INTRAMUSCULAR | Status: DC | PRN
  Administered 2013-02-13: 120 mg via INTRAVENOUS

## 2013-02-13 MED ORDER — CARBOPROST TROMETHAMINE 250 MCG/ML IM SOLN
INTRAMUSCULAR | Status: DC | PRN
  Administered 2013-02-13: 250 ug

## 2013-02-13 MED ORDER — SODIUM CHLORIDE 0.9 % IR SOLN
Status: DC | PRN
  Administered 2013-02-13: 1000 mL

## 2013-02-13 MED ORDER — FENTANYL CITRATE 0.05 MG/ML IJ SOLN
25.0000 ug | INTRAMUSCULAR | Status: DC | PRN
  Administered 2013-02-13: 0.5 ug via INTRAVENOUS

## 2013-02-13 MED ORDER — SIMETHICONE 80 MG PO CHEW
80.0000 mg | CHEWABLE_TABLET | Freq: Three times a day (TID) | ORAL | Status: DC
  Administered 2013-02-13 – 2013-02-17 (×16): 80 mg via ORAL

## 2013-02-13 MED ORDER — CITRIC ACID-SODIUM CITRATE 334-500 MG/5ML PO SOLN
ORAL | Status: AC
  Administered 2013-02-13: 30 mL
  Filled 2013-02-13: qty 15

## 2013-02-13 MED ORDER — FENTANYL CITRATE 0.05 MG/ML IJ SOLN
INTRAMUSCULAR | Status: AC
  Filled 2013-02-13: qty 5

## 2013-02-13 MED ORDER — MAGNESIUM SULFATE BOLUS VIA INFUSION
2.0000 g | Freq: Once | INTRAVENOUS | Status: AC
  Administered 2013-02-13: 2 g via INTRAVENOUS
  Filled 2013-02-13: qty 500

## 2013-02-13 MED ORDER — LACTATED RINGERS IV SOLN
INTRAVENOUS | Status: DC
  Administered 2013-02-13: 09:00:00 via INTRAVENOUS

## 2013-02-13 MED ORDER — SCOPOLAMINE 1 MG/3DAYS TD PT72
MEDICATED_PATCH | TRANSDERMAL | Status: AC
  Filled 2013-02-13: qty 1

## 2013-02-13 MED ORDER — PRENATAL MULTIVITAMIN CH
1.0000 | ORAL_TABLET | Freq: Every day | ORAL | Status: DC
  Administered 2013-02-13 – 2013-02-17 (×5): 1 via ORAL
  Filled 2013-02-13 (×5): qty 1

## 2013-02-13 MED ORDER — PHENYLEPHRINE 40 MCG/ML (10ML) SYRINGE FOR IV PUSH (FOR BLOOD PRESSURE SUPPORT)
PREFILLED_SYRINGE | INTRAVENOUS | Status: AC
  Filled 2013-02-13: qty 5

## 2013-02-13 MED ORDER — ONDANSETRON HCL 4 MG/2ML IJ SOLN: 4.0000 mg | INTRAMUSCULAR | Status: DC | PRN

## 2013-02-13 MED ORDER — TRAZODONE HCL 50 MG PO TABS
50.0000 mg | ORAL_TABLET | Freq: Every day | ORAL | Status: DC
  Administered 2013-02-13 – 2013-02-16 (×4): 50 mg via ORAL
  Filled 2013-02-13 (×5): qty 1

## 2013-02-13 MED ORDER — PROMETHAZINE HCL 25 MG/ML IJ SOLN: 6.2500 mg | INTRAMUSCULAR | Status: DC | PRN

## 2013-02-13 MED ORDER — BUPIVACAINE HCL (PF) 0.25 % IJ SOLN
INTRAMUSCULAR | Status: AC
  Filled 2013-02-13: qty 30

## 2013-02-13 MED ORDER — DULOXETINE HCL 60 MG PO CPEP
60.0000 mg | ORAL_CAPSULE | Freq: Every day | ORAL | Status: DC
  Administered 2013-02-13 – 2013-02-16 (×4): 60 mg via ORAL
  Filled 2013-02-13 (×5): qty 1

## 2013-02-13 MED ORDER — MIDAZOLAM HCL 2 MG/2ML IJ SOLN
INTRAMUSCULAR | Status: AC
  Filled 2013-02-13: qty 2

## 2013-02-13 MED ORDER — OXYTOCIN 10 UNIT/ML IJ SOLN
Freq: Once | INTRAVENOUS | Status: AC
  Administered 2013-02-13: 19:00:00 via INTRAVENOUS
  Filled 2013-02-13: qty 500

## 2013-02-13 MED ORDER — CEFAZOLIN SODIUM-DEXTROSE 2-3 GM-% IV SOLR
INTRAVENOUS | Status: AC
  Filled 2013-02-13: qty 50

## 2013-02-13 MED ORDER — ACETAMINOPHEN 10 MG/ML IV SOLN
1000.0000 mg | Freq: Four times a day (QID) | INTRAVENOUS | Status: DC
Start: 2013-02-13 — End: 2013-02-13
  Filled 2013-02-13 (×2): qty 100

## 2013-02-13 MED ORDER — EPHEDRINE 5 MG/ML INJ
INTRAVENOUS | Status: AC
  Filled 2013-02-13: qty 10

## 2013-02-13 MED ORDER — PREGABALIN 50 MG PO CAPS
100.0000 mg | ORAL_CAPSULE | Freq: Every day | ORAL | Status: DC
  Administered 2013-02-13 – 2013-02-16 (×4): 100 mg via ORAL
  Filled 2013-02-13 (×2): qty 1
  Filled 2013-02-13 (×3): qty 2

## 2013-02-13 MED ORDER — HYDROMORPHONE HCL PF 1 MG/ML IJ SOLN
INTRAMUSCULAR | Status: DC | PRN
  Administered 2013-02-13 (×2): 0.5 mg via INTRAVENOUS

## 2013-02-13 MED ORDER — OXYTOCIN 10 UNIT/ML IJ SOLN
INTRAMUSCULAR | Status: AC
  Filled 2013-02-13: qty 2

## 2013-02-13 MED ORDER — ONDANSETRON HCL 4 MG/2ML IJ SOLN
INTRAMUSCULAR | Status: DC | PRN
  Administered 2013-02-13: 4 mg via INTRAVENOUS

## 2013-02-13 MED ORDER — PROPOFOL 10 MG/ML IV BOLUS
INTRAVENOUS | Status: DC | PRN
  Administered 2013-02-13: 20 mg via INTRAVENOUS
  Administered 2013-02-13: 200 mg via INTRAVENOUS
  Administered 2013-02-13: 30 mg via INTRAVENOUS

## 2013-02-13 MED ORDER — LACTATED RINGERS IV SOLN
INTRAVENOUS | Status: DC
  Administered 2013-02-13 – 2013-02-14 (×3): via INTRAVENOUS

## 2013-02-13 MED ORDER — MENTHOL 3 MG MT LOZG: 1.0000 | LOZENGE | OROMUCOSAL | Status: DC | PRN

## 2013-02-13 MED ORDER — OXYTOCIN 10 UNIT/ML IJ SOLN
40.0000 [IU] | INTRAMUSCULAR | Status: DC | PRN
  Administered 2013-02-13: 40 [IU] via INTRAVENOUS

## 2013-02-13 MED ORDER — DIPHENHYDRAMINE HCL 25 MG PO CAPS: 25.0000 mg | ORAL_CAPSULE | Freq: Four times a day (QID) | ORAL | Status: DC | PRN

## 2013-02-13 MED ORDER — SENNOSIDES-DOCUSATE SODIUM 8.6-50 MG PO TABS
2.0000 | ORAL_TABLET | Freq: Every day | ORAL | Status: DC
  Administered 2013-02-13 – 2013-02-15 (×3): 2 via ORAL

## 2013-02-13 MED ORDER — LACTATED RINGERS IV SOLN: INTRAVENOUS | Status: DC

## 2013-02-13 MED ORDER — LIDOCAINE HCL (CARDIAC) 20 MG/ML IV SOLN
INTRAVENOUS | Status: DC | PRN
  Administered 2013-02-13: 30 mg via INTRAVENOUS

## 2013-02-13 MED ORDER — MEPERIDINE HCL 25 MG/ML IJ SOLN: 6.2500 mg | INTRAMUSCULAR | Status: DC | PRN

## 2013-02-13 MED ORDER — PANTOPRAZOLE SODIUM 40 MG PO TBEC
40.0000 mg | DELAYED_RELEASE_TABLET | Freq: Two times a day (BID) | ORAL | Status: DC
  Administered 2013-02-13 – 2013-02-17 (×8): 40 mg via ORAL
  Filled 2013-02-13 (×10): qty 1

## 2013-02-13 MED ORDER — WITCH HAZEL-GLYCERIN EX PADS: 1.0000 "application " | MEDICATED_PAD | CUTANEOUS | Status: DC | PRN

## 2013-02-13 MED ORDER — ONDANSETRON HCL 4 MG PO TABS: 4.0000 mg | ORAL_TABLET | ORAL | Status: DC | PRN

## 2013-02-13 MED ORDER — PROPOFOL 10 MG/ML IV EMUL
INTRAVENOUS | Status: AC
  Filled 2013-02-13: qty 20

## 2013-02-13 MED ORDER — MIDAZOLAM HCL 5 MG/5ML IJ SOLN
INTRAMUSCULAR | Status: DC | PRN
  Administered 2013-02-13 (×2): 2 mg via INTRAVENOUS

## 2013-02-13 MED ORDER — HYDROCODONE-ACETAMINOPHEN 5-325 MG PO TABS
1.0000 | ORAL_TABLET | ORAL | Status: DC | PRN
  Administered 2013-02-13: 2 via ORAL
  Administered 2013-02-14 (×2): 1 via ORAL
  Administered 2013-02-14 – 2013-02-16 (×10): 2 via ORAL
  Administered 2013-02-16 (×2): 1 via ORAL
  Administered 2013-02-16 – 2013-02-17 (×5): 2 via ORAL
  Filled 2013-02-13 (×13): qty 2
  Filled 2013-02-13: qty 1
  Filled 2013-02-13 (×2): qty 2
  Filled 2013-02-13 (×2): qty 1
  Filled 2013-02-13 (×2): qty 2
  Filled 2013-02-13: qty 1

## 2013-02-13 MED ORDER — SUCCINYLCHOLINE CHLORIDE 20 MG/ML IJ SOLN
INTRAMUSCULAR | Status: AC
  Filled 2013-02-13: qty 10

## 2013-02-13 SURGICAL SUPPLY — 37 items
CLAMP CORD UMBIL (MISCELLANEOUS) ×4 IMPLANT
CLOTH BEACON ORANGE TIMEOUT ST (SAFETY) ×2 IMPLANT
CONTAINER PREFILL 10% NBF 15ML (MISCELLANEOUS) IMPLANT
DRAPE LG THREE QUARTER DISP (DRAPES) ×4 IMPLANT
DRSG OPSITE 11X17.75 LRG (GAUZE/BANDAGES/DRESSINGS) ×2 IMPLANT
DRSG OPSITE POSTOP 4X10 (GAUZE/BANDAGES/DRESSINGS) ×2 IMPLANT
DURAPREP 26ML APPLICATOR (WOUND CARE) ×2 IMPLANT
ELECT REM PT RETURN 9FT ADLT (ELECTROSURGICAL) ×2
ELECTRODE REM PT RTRN 9FT ADLT (ELECTROSURGICAL) ×1 IMPLANT
EXTRACTOR VACUUM M CUP 4 TUBE (SUCTIONS) IMPLANT
GLOVE BIO SURGEON STRL SZ7.5 (GLOVE) ×2 IMPLANT
GOWN PREVENTION PLUS XLARGE (GOWN DISPOSABLE) ×2 IMPLANT
GOWN STRL REIN XL XLG (GOWN DISPOSABLE) ×4 IMPLANT
KIT ABG SYR 3ML LUER SLIP (SYRINGE) IMPLANT
NDL SAFETY ECLIPSE 18X1.5 (NEEDLE) ×1 IMPLANT
NEEDLE BLUNT 18X1 FOR OR ONLY (NEEDLE) ×2 IMPLANT
NEEDLE HYPO 18GX1.5 SHARP (NEEDLE) ×1
NEEDLE HYPO 25X1 1.5 SAFETY (NEEDLE) ×2 IMPLANT
NEEDLE HYPO 25X5/8 SAFETYGLIDE (NEEDLE) IMPLANT
NEEDLE SPNL 20GX3.5 QUINCKE YW (NEEDLE) ×2 IMPLANT
NS IRRIG 1000ML POUR BTL (IV SOLUTION) ×2 IMPLANT
PACK C SECTION WH (CUSTOM PROCEDURE TRAY) ×2 IMPLANT
PAD ABD 7.5X8 STRL (GAUZE/BANDAGES/DRESSINGS) ×2 IMPLANT
STAPLER VISISTAT 35W (STAPLE) ×2 IMPLANT
SUT MNCRL 0 VIOLET CTX 36 (SUTURE) ×2 IMPLANT
SUT MON AB 2-0 CT1 27 (SUTURE) ×2 IMPLANT
SUT MON AB-0 CT1 36 (SUTURE) ×4 IMPLANT
SUT MONOCRYL 0 CTX 36 (SUTURE) ×2
SUT PLAIN 0 NONE (SUTURE) IMPLANT
SUT PLAIN 2 0 XLH (SUTURE) ×2 IMPLANT
SYR BULB 3OZ (MISCELLANEOUS) ×2 IMPLANT
SYR CONTROL 10ML LL (SYRINGE) ×2 IMPLANT
SYRINGE 10CC LL (SYRINGE) ×2 IMPLANT
TAPE CLOTH SURG 4X10 WHT LF (GAUZE/BANDAGES/DRESSINGS) ×2 IMPLANT
TOWEL OR 17X24 6PK STRL BLUE (TOWEL DISPOSABLE) ×6 IMPLANT
TRAY FOLEY CATH 14FR (SET/KITS/TRAYS/PACK) ×2 IMPLANT
WATER STERILE IRR 1000ML POUR (IV SOLUTION) ×2 IMPLANT

## 2013-02-13 NOTE — Progress Notes (Signed)
Notified Dr Billy Coast of pt status, FHR tracing, Lab work, difficulty obtaining second line, IV team at bedside, 1-2 beats of clonus, brisk reflexes, consents verified, reviewed orders for am meds, and pt is ready for OR.  Dr Willa Frater at bedside discussing anesthsia with pt. Pt verbalizes understanding and has no further questions.  IV team successful and blood sent for pending orders

## 2013-02-13 NOTE — Anesthesia Postprocedure Evaluation (Signed)
  Anesthesia Post-op Note  Patient: Sandra Tran  Procedure(s) Performed: Procedure(s) (LRB): Primary cesrean section with delivery of twin A boy at 31.  Twin B girl at  1125. (N/A)  Patient Location: PACU  Anesthesia Type: General  Level of Consciousness: awake and alert   Airway and Oxygen Therapy: Patient Spontanous Breathing  Post-op Pain: mild  Post-op Assessment: Post-op Vital signs reviewed, Patient's Cardiovascular Status Stable, Respiratory Function Stable, Patent Airway and No signs of Nausea or vomiting  Last Vitals:  Filed Vitals:   02/13/13 1300  BP:   Pulse:   Temp: 36.3 C  Resp:     Post-op Vital Signs: stable   Complications: No apparent anesthesia complications

## 2013-02-13 NOTE — Op Note (Signed)
Cesarean Section Procedure Note  Indications: severe preeclampsia and Twins   Pre-operative Diagnosis: 31 week 4 day pregnancy.  Post-operative Diagnosis: same  Surgeon: Lenoard Aden   Assistants: Fredric Mare, CNM  Anesthesia: General endotracheal anesthesia and Local anesthesia 0.25.% bupivacaine  ASA Class: 2  Procedure Details  The patient was seen in the Holding Room. The risks, benefits, complications, treatment options, and expected outcomes were discussed with the patient.  The patient concurred with the proposed plan, giving informed consent. The risks of anesthesia, infection, bleeding and possible injury to other organs discussed. Injury to bowel, bladder, or ureter with possible need for repair discussed. Possible need for transfusion with secondary risks of hepatitis or HIV acquisition discussed. Post operative complications to include but not limited to DVT, PE and Pneumonia noted. The site of surgery properly noted/marked. The patient was taken to Operating Room # 9, identified as Monique Hefty and the procedure verified as C-Section Delivery. A Time Out was held and the above information confirmed.  After induction of anesthesia, the patient was draped and prepped in the usual sterile manner. A Pfannenstiel incision was made and carried down through the subcutaneous tissue to the fascia. Fascial incision was made and extended transversely using Mayo scissors. The fascia was separated from the underlying rectus tissue superiorly and inferiorly. The peritoneum was identified and entered. Peritoneal incision was extended longitudinally. The utero-vesical peritoneal reflection was incised transversely and the bladder flap was bluntly freed from the lower uterine segment. A low transverse uterine incision(Kerr hysterotomy) was made. Delivered from vtx presentation was a  Female twin A with Apgar scores of pending per NICU at one minute and pending per NICU at five minutes. Bulb  suctioning gently performed. Delivered from vtx presentation was a  female twin B with Apgar scores of pending per NICU at one minute and pending per NICU at five minutes. Bulb suctioning gently performed.Neonatal team in attendance.After the umbilical cord was clamped and cut cord blood was obtained for evaluation. The placentas were removed intact and appeared normal. The uterus was curetted with a dry lap pack. Good hemostasis was noted.The uterine outline, tubes and ovaries appeared normal. The uterine incision was closed with running locked sutures of 0 Monocryl x 2 layers. Hemostasis was observed. Surgicel placed on uterine incision.The parietal peritoneum was closed with a running 2-0 Monocryl suture. The fascia was then reapproximated with running sutures of 0 Monocryl. [place] tissue closed with a 2-0 plain. The skin was reapproximated with staples.  Instrument, sponge, and needle counts were correct prior the abdominal closure and at the conclusion of the case.   Findings: above  Estimated Blood Loss:  600         Drains: foley                 Specimens: placentas to pathology                 Complications:  None; patient tolerated the procedure well.         Disposition: PACU - hemodynamically stable.         Condition: stable  Attending Attestation: I performed the procedure.

## 2013-02-13 NOTE — Transfer of Care (Signed)
Immediate Anesthesia Transfer of Care Note  Patient: Sandra Tran  Procedure(s) Performed: Procedure(s): Primary cesrean section with delivery of twin A boy at 52.  Twin B girl at  1125. (N/A)  Patient Location: PACU  Anesthesia Type:General  Level of Consciousness: awake, alert  and patient cooperative  Airway & Oxygen Therapy: Patient Spontanous Breathing and Patient connected to nasal cannula oxygen  Post-op Assessment: Report given to PACU RN and Post -op Vital signs reviewed and stable  Post vital signs: Reviewed and stable  Complications: No apparent anesthesia complications

## 2013-02-13 NOTE — Progress Notes (Signed)
accompanied pt. To NICU via wheelchair. No c/o tolerated OOB and a little ambulating very well

## 2013-02-13 NOTE — OR Nursing (Signed)
Uterus massaged by S. Ollin Hochmuth Charity fundraiser. Four tubes of  Cord blood sent to lab.  25 cc of blood evacuated from uterus during uterine massage.

## 2013-02-13 NOTE — Progress Notes (Signed)
Patient ID: Sandra Tran, female   DOB: July 31, 1986, 27 y.o.   MRN: [id number] Consented for surgery and blood transfusion. Risks vs benefits discussed. Labs stable. Will transfuse in OR. NICU and Anesthesia aware. General anesthesia per anesthesia and patient preference.

## 2013-02-14 LAB — TYPE AND SCREEN
Unit division: 0
Unit division: 0
Unit division: 0
Unit division: 0

## 2013-02-14 LAB — COMPREHENSIVE METABOLIC PANEL
CO2: 18 mEq/L — ABNORMAL LOW (ref 19–32)
Calcium: 7.3 mg/dL — ABNORMAL LOW (ref 8.4–10.5)
Creatinine, Ser: 0.79 mg/dL (ref 0.50–1.10)
GFR calc Af Amer: >90 mL/min (ref >90)
GFR calc non Af Amer: >90 mL/min (ref >90)
Glucose, Bld: 98 mg/dL (ref 70–99)
Total Protein: 4.8 g/dL — ABNORMAL LOW (ref 6.0–8.3)

## 2013-02-14 LAB — RPR: RPR Ser Ql: NONREACTIVE

## 2013-02-14 LAB — CBC
HCT: 30.8 % — ABNORMAL LOW (ref 36.0–46.0)
Hemoglobin: 9.8 g/dL — ABNORMAL LOW (ref 12.0–15.0)
MCH: 25 pg — ABNORMAL LOW (ref 26.0–34.0)
MCHC: 31.8 g/dL (ref 30.0–36.0)
MCV: 78.6 fL (ref 78.0–100.0)
Platelets: 88 10*3/uL — ABNORMAL LOW (ref 150–400)
RBC: 3.92 MIL/uL (ref 3.87–5.11)
RDW: 15.5 % (ref 11.5–15.5)
WBC: 17.3 10*3/uL — ABNORMAL HIGH (ref 4.0–10.5)

## 2013-02-14 MED ORDER — DOCUSATE SODIUM 100 MG PO CAPS
100.0000 mg | ORAL_CAPSULE | Freq: Every day | ORAL | Status: DC
  Administered 2013-02-14 – 2013-02-15 (×2): 100 mg via ORAL
  Filled 2013-02-14 (×2): qty 1

## 2013-02-14 MED ORDER — POLYSACCHARIDE IRON COMPLEX 150 MG PO CAPS
150.0000 mg | ORAL_CAPSULE | Freq: Every day | ORAL | Status: DC
  Administered 2013-02-14 – 2013-02-15 (×2): 150 mg via ORAL
  Filled 2013-02-14 (×3): qty 1

## 2013-02-14 NOTE — Progress Notes (Signed)
Pt. Just got back to bed from doing peri-care and ambulating around room. Will continue to monitor BP

## 2013-02-14 NOTE — Lactation Note (Signed)
This note was copied from the chart of BoyA  Sharnette Kitamura. Lactation Consultation Note  Patient Name: Sandra Tran [id number] Date: 02/14/2013 Reason for consult: Initial assessment   Maternal Data Formula Feeding for Exclusion: Yes Reason for exclusion: Admission to Intensive Care Unit (ICU) post-partum Does the patient have breastfeeding experience prior to this delivery?: No  Feeding   LATCH Score/Interventions                      Lactation Tools Discussed/Used     Consult Status Consult Status: Follow-up Date: 02/15/13 Follow-up type: In-patient  Mom reports that she is pumping 5-10 cc's Colostrum. Praise given. Reports that she has been leaking for several [name]. Reports that pumping feels fine with no pain. Mom states babies are taking about 6 cc's each and she is pleased babies are taking her milk. No questions at present. Has NICU booklet. Several visitors present so I did not reviewed it with her. Wants pump rental at [place].   Pamelia Hoit 02/14/2013, 3:28 PM

## 2013-02-14 NOTE — Progress Notes (Signed)
Pt. C/o pain at PIV site. Edema noted above and below site-no redness. PIV d/c'd. Sandra Tran, CNM notified. Orders received to d/c Magnesium. Ok to not have IV access at present d/t pt being a very difficult IV stick. Will keep in AICU and watch closely overnight, monitoring stict I&O. Discussed POC with pt and family-verbalized understanding.

## 2013-02-14 NOTE — Progress Notes (Addendum)
POSTOPERATIVE DAY # 1 S/P CS TWINS @ 31 weeks / severe PEC  S:         Reports feeling sore - some burning lateral to incisional site (discussed r/t to fascial layer -normal)             Tolerating po intake / no nausea / no vomiting / + flatus / no BM             Bleeding is light             Pain controlled with percocet             Up ad lib / ambulatory/ voiding QS  Newborn sin NICU - stable this AM feeding    O:  VS: BP 153/94  Pulse 79  Temp(Src) 97.3 F (36.3 C) (Oral)  Resp 20  Ht 5\' 7"  (1.702 m)  Wt 101.061 kg (222 lb 12.8 oz)  BMI 34.89 kg/m2  SpO2 98%  LMP 07/07/2012   LABS:  Recent Labs  02/13/13 0815 02/13/13 1837  WBC 23.7* 22.0*  HGB 8.1* 9.3*  PLT 68* 82*                          CBC - not collected by lab due to difficult stick (lost venipuncture site after CMP collected)                      - call placed to vascular lab technician to come draw blood (phlebotomist at [facility] decline to re-stick)              CMP - creatnine 0.79 (down from 1.0 pre-op / 0.93 post-op)  LE stable without elevation- SGOT 38 / SGPT 8                        sodium remains slightly elevated134 / potassium normal  4.5                        magnesium level 4.4                         I&O: equivocal this AM for postop I&O balance ( -10ml)                      Net + 1714 since admission                                   Urine outpt 300-800 ml / hour             Physical Exam:             Alert and Oriented X3  Lungs: Clear and unlabored  Heart: regular rate and rhythm / no mumurs  Abdomen: soft, non-tender, non-distended, hypoactive BS             Fundus: firm, non-tender, U-1             Dressing: pressure dressing intact - no drainage             Lochia: scant  Extremities: 3+ dependent edema, no calf pain or tenderness, SCD in place in bed  A:        POD # 1 S/P CS @ 31 weeks with twin gestation  severe pre-eclampsia            thrombocytopenia            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 Recommend continuation of magnesium for at least 12 hours - use long enough to gain maximum benefit of the medication. If discontinued prematurely in severe pre-eclampsia - condition can rebound and status will worsen requiring additional hospital management and restart of IV magnesium therapy.  Discussed dependent edema management versus severe anemia - need to promote mobilization of retained fluid in tissues without causing significant loss to circulating volume that could cause symptoms (dizziness / SOB / syncope). Will have initial diuresis normally with postpartum - magnesium will reduce vasospasms to help resolve the PEC component of dependent edema - may need mild diuretic with emphasis to maintain good oral hydration with water while reducing sodium intake in the first week postpartum. Understands & agrees with plan. Reassured dependent edema usually completely resolved by 7-10 days postpartum.  Additionally reassured - surgery went well - newborn twins stable in NICU and doing well - postoperatively she is improving and anticipate continued improvement in next 2-3 days with likely discharge in 3-4 days.  primary Dr Billy Coast managing patient - updated with status this AM / will update again when CBC resulted    Sandra Tran CNM, MSN, [facility] 02/14/2013, 9:49 AM  Agree with assessment as noted above. CBC and plts stable. Will watch UO closely  and likely DC MgSO4 later today. Questions answered. Babies doing well in NICU.

## 2013-02-14 NOTE — Anesthesia Postprocedure Evaluation (Signed)
Anesthesia Post Note  Patient: Sandra Tran  Procedure(s) Performed: Procedure(s) (LRB): Primary cesrean section with delivery of twin A boy at 74.  Twin B girl at  1125. (N/A)  Anesthesia type: General  Patient location: AICU  Post pain: Pain level controlled  Post assessment: Post-op Vital signs reviewed  Last Vitals:  Filed Vitals:   02/14/13 0923  BP: 153/94  Pulse: 79  Temp:   Resp: 20    Post vital signs: Reviewed  Level of consciousness: sedated  Complications: No apparent anesthesia complications

## 2013-02-14 NOTE — Progress Notes (Addendum)
Pt c/o difference in temperature in LE's.  BLE noted to have 3+ pitting edema, which is slightly worse from this am after pt has been up for several hours today.  LLE  warm to touch with + pedal pulses and negative Homan's sign.  RLE cool/cold to touch with + pedal pulses and negative Homan's sign. No significant color changes appreciated.   BLE's elevated and SCD's remain on.  These S&S reported to T. Fredric Mare, CNM. No new orders received. Will continue to observe closely and report further changes as necessary.

## 2013-02-14 NOTE — Progress Notes (Signed)
Foley removed.

## 2013-02-14 NOTE — Progress Notes (Signed)
CSW spoke with RN.  CSW will ask weekday CSW to consult with MOB when off MAG and appropriate.    [phone]

## 2013-02-14 NOTE — Progress Notes (Signed)
81 - phlebotomist unable to draw blood with several attempts, states she will send a co-worker at [employer]

## 2013-02-14 NOTE — Progress Notes (Signed)
1 vicodin given per pt. Request for incisional pain

## 2013-02-15 ENCOUNTER — Encounter (HOSPITAL_COMMUNITY): Payer: Self-pay | Admitting: Obstetrics and Gynecology

## 2013-02-15 LAB — COMPREHENSIVE METABOLIC PANEL
ALT: 11 U/L (ref 0–35)
AST: 39 U/L — ABNORMAL HIGH (ref 0–37)
Albumin: 1.9 g/dL — ABNORMAL LOW (ref 3.5–5.2)
Alkaline Phosphatase: 152 U/L — ABNORMAL HIGH (ref 39–117)
BUN: 10 mg/dL (ref 6–23)
CO2: 21 mEq/L (ref 19–32)
Calcium: 7.8 mg/dL — ABNORMAL LOW (ref 8.4–10.5)
Chloride: 105 mEq/L (ref 96–112)
Creatinine, Ser: 0.72 mg/dL (ref 0.50–1.10)
GFR calc Af Amer: >90 mL/min (ref >90)
GFR calc non Af Amer: >90 mL/min (ref >90)
Glucose, Bld: 82 mg/dL (ref 70–99)
Potassium: 4.2 mEq/L (ref 3.5–5.1)
Sodium: 136 mEq/L (ref 135–145)
Total Bilirubin: 0.3 mg/dL (ref 0.3–1.2)
Total Protein: 5.1 g/dL — ABNORMAL LOW (ref 6.0–8.3)

## 2013-02-15 LAB — CBC
HCT: 29.5 % — ABNORMAL LOW (ref 36.0–46.0)
Hemoglobin: 9.4 g/dL — ABNORMAL LOW (ref 12.0–15.0)
MCH: 25 pg — ABNORMAL LOW (ref 26.0–34.0)
MCHC: 31.9 g/dL (ref 30.0–36.0)
MCV: 78.5 fL (ref 78.0–100.0)
Platelets: 93 10*3/uL — ABNORMAL LOW (ref 150–400)
RBC: 3.76 MIL/uL — ABNORMAL LOW (ref 3.87–5.11)
RDW: 15.8 % — ABNORMAL HIGH (ref 11.5–15.5)
WBC: 14.8 10*3/uL — ABNORMAL HIGH (ref 4.0–10.5)

## 2013-02-15 MED ORDER — HYDROCHLOROTHIAZIDE 12.5 MG PO CAPS
12.5000 mg | ORAL_CAPSULE | Freq: Every day | ORAL | Status: DC
  Administered 2013-02-15 – 2013-02-16 (×2): 12.5 mg via ORAL
  Filled 2013-02-15 (×3): qty 1

## 2013-02-15 MED ORDER — MAGNESIUM OXIDE 400 (241.3 MG) MG PO TABS
400.0000 mg | ORAL_TABLET | Freq: Every day | ORAL | Status: DC
  Administered 2013-02-15 – 2013-02-17 (×3): 400 mg via ORAL
  Filled 2013-02-15 (×4): qty 1

## 2013-02-15 MED ORDER — HYDROCHLOROTHIAZIDE 12.5 MG PO CAPS
12.5000 mg | ORAL_CAPSULE | Freq: Every day | ORAL | Status: DC
  Filled 2013-02-15: qty 1

## 2013-02-15 NOTE — Progress Notes (Signed)
POSTOPERATIVE DAY # 2 S/P CS @ 31 weeks with twins / severe PEC  S:         Reports feeling well - no headache / no visual changes / no epigastric pain                                               - reports twitching in legs and leg cramps             Tolerating po intake / no nausea / no vomiting / + flatus / no BM             Bleeding is light             Pain controlled with percocet             Up ad lib / ambulatory/ voiding QS  Newborn stable in NICU - Mom pumping for breast-feeding    O:  VS:  BP 145/89  Pulse 78  Temp(Src) 97.5 F (36.4 C) (Oral)  Resp 18  Ht 5\' 7"  (1.702 m)  Wt 100.109 kg (220 lb 11.2 oz)  BMI 34.56 kg/m2  SpO2 100%  LMP 07/07/2012              Blood pressure range: 136 -152/ 85-95                         I&O: net negative                      24 hours negative -180                      Postoperative -1538             Weight: 100.1kg (down 1 pound)             Physical Exam:             Alert and Oriented X3  Lungs: Clear and unlabored  Heart: regular rate and rhythm / no mumurs  Abdomen: soft, non-tender, non-distended, active BS             Fundus: firm, non-tender, U-1             Dressing intact pressure dressing (will remove pressure dressing today)       Lochia: scant  Extremities: 1-2+ dependent edema (decreased edema - pedal edema remains pitting 2+)                                  no calf pain or tenderness / equally warm bilateral lower extremities, negative Homans                                  DTR remain 2+ bilaterally / no clonus                                  intermittent involuntary lower extremity shaking  right arm edematous from IV infiltration last PM  A:        POD # 2 S/P CS            severe pre-eclampsia - improving            thrombocytopenia - improving            chronic IDA anemia - stable hemodynamically  P:        Routine postoperative care              Stable off magnesium  sulfate this am - continued diuresis / BP stable & improving / no PIH symptoms                   - will transfer out of AICU - continue STRICT I&O and daily weights                   - Dr Billy Coast updated with patient status                   - repeat labs now and again in AM                   - start HCTZ 12.5 today - maintain adequate water intake today                   - Niferex started today with colace and magnesium supplement in addition to PNV   Marlinda Mike CNM, MSN, [facility] 02/15/2013, 9:18 AM  Agree with plan Diuresing well Labs stable BP increasing May need to add Procardia

## 2013-02-15 NOTE — Progress Notes (Signed)
02/15/13 1600  Clinical Encounter Type  Visited With Family;Patient not available (Husband Viviann Spare in NICU)  Visit Type Initial;Spiritual support;Social support  Spiritual Encounters  Spiritual Needs Emotional   Visited with husband and his dad on NICU.  Family was pleased to learn of spiritual care availability.  One complication is that pt travels extensively for work, so there will be a lot of adjustment as parents bring babies home.  Provided intro to spiritual care and chaplain availability.  [address] [place], [place] [phone]

## 2013-02-15 NOTE — Progress Notes (Signed)
1155 Pt transferred to [facility] Rm #[id number] via w/c. Report given to Salena Saner, RN

## 2013-02-15 NOTE — Progress Notes (Signed)
UR chart review completed.  

## 2013-02-16 LAB — CBC
HCT: 26.9 % — ABNORMAL LOW (ref 36.0–46.0)
Hemoglobin: 8.4 g/dL — ABNORMAL LOW (ref 12.0–15.0)
MCH: 24.7 pg — ABNORMAL LOW (ref 26.0–34.0)
MCHC: 31.2 g/dL (ref 30.0–36.0)
MCV: 79.1 fL (ref 78.0–100.0)
Platelets: 97 10*3/uL — ABNORMAL LOW (ref 150–400)
RBC: 3.4 MIL/uL — ABNORMAL LOW (ref 3.87–5.11)
RDW: 16 % — ABNORMAL HIGH (ref 11.5–15.5)
WBC: 11.9 10*3/uL — ABNORMAL HIGH (ref 4.0–10.5)

## 2013-02-16 LAB — COMPREHENSIVE METABOLIC PANEL
ALT: 14 U/L (ref 0–35)
AST: 29 U/L (ref 0–37)
Albumin: 1.8 g/dL — ABNORMAL LOW (ref 3.5–5.2)
Alkaline Phosphatase: 166 U/L — ABNORMAL HIGH (ref 39–117)
BUN: 11 mg/dL (ref 6–23)
CO2: 26 mEq/L (ref 19–32)
Calcium: 8 mg/dL — ABNORMAL LOW (ref 8.4–10.5)
Chloride: 104 mEq/L (ref 96–112)
Creatinine, Ser: 0.82 mg/dL (ref 0.50–1.10)
GFR calc Af Amer: >90 mL/min (ref >90)
GFR calc non Af Amer: >90 mL/min (ref >90)
Glucose, Bld: 83 mg/dL (ref 70–99)
Potassium: 4.5 mEq/L (ref 3.5–5.1)
Sodium: 137 mEq/L (ref 135–145)
Total Bilirubin: 0.3 mg/dL (ref 0.3–1.2)
Total Protein: 4.7 g/dL — ABNORMAL LOW (ref 6.0–8.3)

## 2013-02-16 MED ORDER — POLYSACCHARIDE IRON COMPLEX 150 MG PO CAPS
150.0000 mg | ORAL_CAPSULE | Freq: Two times a day (BID) | ORAL | Status: DC
  Administered 2013-02-16 – 2013-02-17 (×3): 150 mg via ORAL
  Filled 2013-02-16 (×5): qty 1

## 2013-02-16 MED ORDER — NIFEDIPINE ER 30 MG PO TB24
30.0000 mg | ORAL_TABLET | Freq: Every day | ORAL | Status: DC
  Administered 2013-02-16 – 2013-02-17 (×2): 30 mg via ORAL
  Filled 2013-02-16 (×3): qty 1

## 2013-02-16 MED ORDER — DOCUSATE SODIUM 100 MG PO CAPS
100.0000 mg | ORAL_CAPSULE | Freq: Two times a day (BID) | ORAL | Status: DC
  Administered 2013-02-16 – 2013-02-17 (×3): 100 mg via ORAL
  Filled 2013-02-16 (×3): qty 1

## 2013-02-16 NOTE — Progress Notes (Addendum)
POSTOPERATIVE DAY # 3 S/P CS @ 31 weeks severe PEC / twins   S:         Reports feeling mostly sore / no PIH signs - muscle twitching and cramps resolved this am / really tired             Tolerating po intake / no nausea / no vomiting / + flatus / + BM             Bleeding is light             Pain controlled with percocet             Up ad lib / ambulatory/ voiding QS  Newborns stable  in  NICU /  pumping for breast feeding  / Circumcision planned before discharge from NICU  O:  VS: BP 156/95  Pulse 67  Temp(Src) 98.9 F (37.2 C) (Oral)  Resp 20  Ht 5\' 7"  (1.702 m)  Wt 97.985 kg (216 lb 0.3 oz)  BMI 33.83 kg/m2  SpO2 96%  LMP 07/07/2012  Blood pressure range: 131-156 / 83-95  Weight: down 4 pounds   LABS:  Recent Labs  02/15/13 1025 02/16/13 0600  WBC 14.8* 11.9*  HGB 9.4* 8.4*  PLT 93* 97*                           I&O: Intake/Output     05/26 0701 - 05/27 0700 05/27 0701 - 05/28 0700   P.O. 3820    I.V. (mL/kg)     Total Intake(mL/kg) 3820 (39)    Urine (mL/kg/hr) 5600 (2.4)    Total Output 5600     Net -1780          Stool Occurrence 1 x                 Physical Exam:             Alert and Oriented X3  Lungs: Clear and unlabored  Heart: regular rate and rhythm / no mumurs  Abdomen: soft, non-tender, non-distended, active BS             Fundus: firm, non-tender, U-2             Dressing intact honeycomb - pressure dressing OFF              Incision:  approximated with staples / no erythema / no ecchymosis / no drainage  Perineum: no edema  Lochia: light  Extremities: 1+ today - decreasing edema, no calf pain or tenderness, negative Homans  A:        POD # 3 S/P CS            Severe preeclampsia            Chronic IDA compounded by ABL postoperatively            Thrombocytopenia - improving  P:        Routine postoperative care              Increase iron to BID with colace             Maintain adequate water hydration               Discussed BP  remaining elevated but labs improving  / / weight dropping / edema decreasing / platelets rising.  May take up to 2 weeks for BP to return to normal -  PEC risks continue for 2 weeks so need to be monitored with office visits / call with any warning signs at home. May treat with low dose antihypertensive if BP rise any higher. Will discuss with Dr Billy Coast.  Update Dr Billy Coast this am with status - consult if antihypertensive is warranted or continue current management. Instructions to continue current course at this time.    Marlinda Mike CNM, MSN, [facility] 02/16/2013, 8:57 AM  Persistently elevated BPs. Asymptomatic. BPs ranging 140-160s/90-110 today. UO excellent. Will start Procardia XL 30mg  qd.

## 2013-02-16 NOTE — Progress Notes (Signed)
Clinical Social Work Department PSYCHOSOCIAL ASSESSMENT - MATERNAL/CHILD 02/16/2013  Patient:  Sandra Tran,Sandra Tran  Account Number:  [id number]  Admit Date:  02/11/2013  Childs Name:   Sandra and Sandra Tran    Clinical Social Worker:  Lorrin Nawrot, LCSW   Date/Time:  02/16/2013 03:30 PM  Date Referred:  02/16/2013   Referral source  NICU     Referred reason  NICU    Other referral source:    I:  FAMILY / HOME ENVIRONMENT Child's legal guardian:  PARENT  Guardian - Name Guardian - Age Guardian - Address  Lizzet Heasley [address], [place], [place] [address]  Steven Baskett  same   Other household support members/support persons Other support:   Parents are currently living with FOB's parents in [place].  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  They plan on staying with FOB's parents until babies are stable enough to travel and MOB can go back to work.    II  PSYCHOSOCIAL DATA Information Source:  Family Interview  Financial and Community Resources Employment:   MOB-Occupational Therapist (travelling)  FOB-Plans to stay home with the children   Financial resources:  Private Insurance If Medicaid - County:    School / Grade:   Maternity Care Coordinator / Child Services Coordination / Early Interventions:   CC4C, parents are interested in applying for Medicaid and WIC.  CSW informed Nita M/Financial Counselor, who will talk with them and CSW gave phone number for WIC.  Cultural issues impacting care:   None indicated    III  STRENGTHS Strengths  Adequate Resources  Compliance with medical plan  Home prepared for Child (including basic supplies)  Other - See comment  Supportive family/friends  Understanding of illness   Strength comment:  Pediatric follow up will be with Dr. Lentz at [facility]   IV  RISK FACTORS AND CURRENT PROBLEMS Current Problem:  YES   Risk Factor & Current Problem Patient Issue Family Issue Risk Factor /  Current Problem Comment  Mental Illness Y N MOB-Severe Anxiety and Depression   N N     V  SOCIAL WORK ASSESSMENT  CSW met with parents in MOB's third floor room/319 to introduce myself and complete assessment for NICU admission.  (CSW reviewed MOB's PNR, which states she has a diagnosis of severe anxiety and depression.)  Parents were quiet, but friendly and state they are doing well.  MOB states she is a little sore, but overall feeling well.  They report babies are doing well.  They are pleased with the care the babies have gotten thus far.  CSW explained ongoing support services offered by NICU CSW and gave contact information.  CSW began to discuss PPD signs and symptoms and MOB stated that she has a therapist/psychiatrist.  She states they have already discussed this.  She sees this doctor on a regular basis and states her symptoms are well maintained with therapy and medication.  She reports taking Lyrica, Cymbalta and Trazadone at this time and will have an Rx for Xanax PRN once she is done breast feeding.  Parents report having most necessary baby items at home and the ability to get everything else they need prior to babies' discharges.  FOB states he does not work because he travels with MOB to wherever she is working as a traveling Occupational Therapist.  He states she will not be working for a period of time and they, therefore, have questions about Medicaid.  CSW   referred them to Nita McCraw/Financial Counselor.  They state no other questions, needs or concerns at this time.  They appeared appreciative and thanked CSW for the visit.  CSW asked them to please call CSW any time.   VI SOCIAL WORK PLAN Social Work Plan  Psychosocial Support/Ongoing Assessment of Needs   Type of pt/family education:   Common emotions/what to expect related to a NICU admission   If child protective services report - county:   If child protective services report - date:   Information/referral to community  resources comment:   CC4C referral will be made.   Other social work plan:    

## 2013-02-17 ENCOUNTER — Other Ambulatory Visit (HOSPITAL_COMMUNITY): Payer: BC Managed Care – PPO

## 2013-02-17 LAB — TYPE AND SCREEN
ABO/RH(D): A POS
Antibody Screen: NEGATIVE
Unit division: 0

## 2013-02-17 MED ORDER — HYDROCHLOROTHIAZIDE 25 MG PO TABS: 25.0000 mg | ORAL_TABLET | Freq: Every day | ORAL | Status: DC

## 2013-02-17 MED ORDER — DSS 100 MG PO CAPS: 100.0000 mg | ORAL_CAPSULE | Freq: Two times a day (BID) | ORAL | Status: DC

## 2013-02-17 MED ORDER — HYDROCHLOROTHIAZIDE 25 MG PO TABS
25.0000 mg | ORAL_TABLET | Freq: Every day | ORAL | Status: DC
  Administered 2013-02-17: 25 mg via ORAL
  Filled 2013-02-17 (×2): qty 1

## 2013-02-17 MED ORDER — NIFEDIPINE ER 30 MG PO TB24: 30.0000 mg | ORAL_TABLET | Freq: Every day | ORAL | Status: DC

## 2013-02-17 MED ORDER — HYDROCODONE-ACETAMINOPHEN 5-325 MG PO TABS: 1.0000 | ORAL_TABLET | ORAL | Status: DC | PRN

## 2013-02-17 NOTE — Progress Notes (Signed)
Discharged instruction given, called [facility] for follower up B/P check at home on Friday and call in to Dr. Billy Coast office. Told pt to call for her follow up appointment  . Pt ambulated out and visit NICU for before leaving.

## 2013-02-17 NOTE — Progress Notes (Signed)
Patient ID: Sandra Tran, female   DOB: 04-13-1986, 27 y.o.   MRN: [id number] POD # 4  Subjective: Denies symptoms of PEC; ready for d/c home   Objective: VS:  BP's 129-146/88-94 since 0915  LABS:  Recent Labs  02/15/13 1025 02/16/13 0600  WBC 14.8* 11.9*  HGB 9.4* 8.4*  HCT 29.5* 26.9*  PLT 93* 97*     Physical Exam:  General: alert, cooperative and no distress Incision: well approximated Uterine Fundus: firm, below umbilicus, nontender Ext: edema +2 and Homans sign is negative, no sign of DVT    A/P: POD # 3/ G1P0102/ S/P C/Section d/t PEC, Twins Persistent labile BP's Doing well and stable for discharge home RX's: Procardia XL 30mg  po QD HCTZ 25mg  po QD x 5 days Ibuprofen 600mg  po Q 6 hrs prn pain #30 Refill x 1 Vicodin 1-2 po Q 6hrs prn #30 Niferex 150mg  po QD/BID #30/#60 Refill x 1 BP recheck in the office Monday, 02/23/13    Signed: Demetrius Revel, MSN, [facility] 02/17/2013, 4:19 PM

## 2013-02-17 NOTE — Progress Notes (Signed)
Patient ID: Novice Vrba, female   DOB: 1985-12-26, 27 y.o.   MRN: [id number] POD # 4  S/P Primary c/s, twins, severe PEC  Subjective: Pt reports feeling well/  Denies HA, SOB, CP, epigastric pain.  C/O feeling fatigue, diaphoretic, "not well" last pm, but those symptoms improved this am Pain controlled with ibuprofen and percocet Tolerating po/Voiding without problems/ No n/v/Flatus pos Activity: out of bed and ambulate Bleeding is light Newborn info:  Information for the patient's newborn:  Kiylah, Loyer [[id number]]  female Information for the patient's newborn:  Babygirl, Trager [[id number]]  female Feeding: breast, pumping   Objective: VS: Blood pressure 159/100, pulse 89, temperature 98.5 F (36.9 C), temperature source Oral, resp. rate 20.   LABS:  Recent Labs  02/15/13 1025 02/16/13 0600  WBC 14.8* 11.9*  HGB 9.4* 8.4*  HCT 29.5* 26.9*  PLT 93* 97*     Physical Exam:  General: alert, cooperative and no distress CV: Regular rate and rhythm Resp: clear Abdomen: soft, nontender, normal bowel sounds Incision: well approximated, mod dependent edema; Tegaderm dressing removed Uterine Fundus: firm, below umbilicus, nontender Lochia: minimal Ext: edema +3 pedal and pretib and Homans sign is negative, no sign of DVT    A/P: POD # 4/ G1P0102/ S/P Primary C/Section d/t PEC, twins Labile BP's persist; will increase HCTZ now and cont to monitor Chronic and ABL Anemia Probable d/c this afternoon Plan BP recheck in the office next week Remove staples prior to d/c Plan reviewed with Dr Billy Coast RX's: Ibuprofen 600mg  po Q 6 hrs prn pain #30 Refill x 1 Percocet 5/325 1 - 2 tabs po every 6 hrs prn pain  #30 No refill Procardia XL 30mg  PO QD HCTZ 25 mg po QD x 5 days Colace 100mg  po TID prn #30 Niferex  150mg  po QD Rt pp visit in 6 wks    Signed: Demetrius Revel, MSN, [facility] 02/17/2013, 9:12 AM

## 2013-02-17 NOTE — Discharge Summary (Signed)
Obstetric Discharge Summary Reason for Admission: G1 P0 @ 31w 2 d with Mercie Eon Twin gestation; Thrombocytopenia/Hyperuricemia; Complicated Anxiety and Depression; Gestational Anemia; at risk and suspicion of PEC Prenatal Procedures: NST, Preeclampsia labs and ultrasound Intrapartum Procedures: cesarean: low cervical, transverse Postpartum Procedures: Magnesium infusion and admission to AICU Complications-Operative and Postpartum: Preeclampsia and Twins at 31wks Hemoglobin  Date Value Range Status  02/16/2013 8.4* 12.0 - 15.0 g/dL Final     HCT  Date Value Range Status  02/16/2013 26.9* 36.0 - 46.0 % Final    Physical Exam:  General: alert, cooperative and no distress Lochia: appropriate Uterine Fundus: firm Incision: healing well DVT Evaluation: No evidence of DVT seen on physical exam. Negative Homan's sign.  Discharge Diagnoses: G1 P1 s/p primary c/s for PEC, Twins.  PEC now resolving.  BP's labile, but more controlled.  Will d/c to home with Procardia and HCTZ.  BP recheck in the office next week. Significant and complicated depression and anxiety, Gestational anemia and Thrombocytopenia  BP recheck in the office next week.  Someone from the office will call patient to arrange appointment.  Discharge Information: Date: 02/17/2013 Activity: pelvic rest Diet: routine Medications: PNV, Colace, Iron, Vicodin and Procardia, HCTZ Condition: stable Instructions: refer to practice specific booklet Discharge to: home Follow-up Information   Follow up with Lenoard Aden, MD In 6 weeks.   Contact information:   [address] [place] [address] [phone]       Newborn Data:   Delila, Kuklinski [[id number]]  Live born female on 02/13/13 Birth Weight: 3 lb 8.1 oz (1590 g) APGAR: 6, 8   Nzinga, Ferran [[id number]]  Live born female on 02/13/13 Birth Weight: 3 lb 4.9 oz (1500 g) APGAR: 6, 8  Infants are stable and remain in NICU.  Riyanshi Wahab K 02/17/2013, 4:33  PM

## 2013-02-18 ENCOUNTER — Encounter (HOSPITAL_COMMUNITY)
Admission: RE | Admit: 2013-02-18 | Discharge: 2013-02-18 | Disposition: A | Discharge status: Home / Self Care | Payer: BC Managed Care – PPO | Source: Ambulatory Visit | Attending: Obstetrics and Gynecology | Admitting: Obstetrics and Gynecology

## 2013-02-18 DIAGNOSIS — O923 Agalactia: Secondary | ICD-10-CM | POA: Insufficient documentation

## 2013-02-23 ENCOUNTER — Ambulatory Visit: Payer: Self-pay

## 2013-02-23 NOTE — Lactation Note (Signed)
This note was copied from the chart of BoyA  Ladye Macnaughton. Lactation Consultation Note Mom at [facility] bedside in NICU, holding baby STS while baby receives NG feed.  Discussed pumping with mom; mom states pumping is going well, that she is getting milk, c/o nipple soreness, request more comfort gels. Comfort gels provided at her request, also provided larger flanges, and advise using extra virgin olive oil or coconut oil to lubricate the flanges. Reinforced hand expression. Questions answered.   Patient Name: Sandra Tran [id number] Date: 02/23/2013 Reason for consult: Follow-up assessment;NICU baby   Maternal Data    Feeding Feeding Type: Breast Milk Feeding method: Tube/Gavage Length of feed: 45 min  LATCH Score/Interventions                      Lactation Tools Discussed/Used     Consult Status Consult Status: Follow-up Follow-up type: In-patient    Octavio Manns [facility] 02/23/2013, 3:14 PM

## 2013-03-19 ENCOUNTER — Ambulatory Visit: Payer: Self-pay

## 2013-03-19 NOTE — Lactation Note (Addendum)
This note was copied from the chart of [name]. Lactation Consultation Note  Patient Name: Sandra Tran RUEAV'W Date: 03/19/2013 Reason for consult: Follow-up assessment;NICU baby;Multiple gestation   Maternal Data    Feeding Feeding Type: Breast Milk Feeding method: Breast Length of feed: 15 min  LATCH Score/Interventions Latch: Grasps breast easily, tongue down, lips flanged, rhythmical sucking. Intervention(s): Skin to skin  Audible Swallowing: A few with stimulation  Type of Nipple: Everted at rest and after stimulation  Comfort (Breast/Nipple): Soft / non-tender     Hold (Positioning): Assistance needed to correctly position infant at breast and maintain latch. Intervention(s): Breastfeeding basics reviewed;Support Pillows;Position options;Skin to skin  LATCH Score: 8  Lactation Tools Discussed/Used     Consult Status             Follow up consult with this mom and baby. He is now 61 weeks old, and 36 3/[redacted] weeks gestation. He  Needs to be paced with feeding from a bottle - developmentally not ready. It was suggested by [place], PT and [place], [place], that he may do better with breast feeding. Mom was willing to try, even though she chooses to pump and bottle feed. Mom had pumped to comfort 1 hours previous. Baby latched in cross cradle hold. He was slow to start, but then ate well for the most part of 15 minutes. I did a pre and post weight, and he transferred 18 mls, with only one brief desaturation, which was quickly self-resolved. Mom eager to breast feed again. I left the scale in the baby's room, and instructed parents in it's use. I also told mom to call me for the next breast feeding, and I would come to observe and assist as needed.  Consult Status: PRN Follow-up type: In-patient (in NICU)    Alfred Levins 03/19/2013, 2:19 PM

## 2013-03-30 ENCOUNTER — Encounter: Payer: Self-pay | Admitting: Family Medicine

## 2013-03-30 ENCOUNTER — Ambulatory Visit (INDEPENDENT_AMBULATORY_CARE_PROVIDER_SITE_OTHER): Payer: BC Managed Care – PPO | Admitting: Family Medicine

## 2013-03-30 VITALS — BP 124/68 | HR 76 | Temp 97.9°F | Ht 66.0 in | Wt 179.0 lb

## 2013-03-30 DIAGNOSIS — Z8659 Personal history of other mental and behavioral disorders: Secondary | ICD-10-CM

## 2013-03-30 DIAGNOSIS — IMO0001 Reserved for inherently not codable concepts without codable children: Secondary | ICD-10-CM

## 2013-03-30 DIAGNOSIS — Z79899 Other long term (current) drug therapy: Secondary | ICD-10-CM

## 2013-03-30 DIAGNOSIS — N3010 Interstitial cystitis (chronic) without hematuria: Secondary | ICD-10-CM

## 2013-03-30 DIAGNOSIS — K219 Gastro-esophageal reflux disease without esophagitis: Secondary | ICD-10-CM

## 2013-03-30 DIAGNOSIS — E785 Hyperlipidemia, unspecified: Secondary | ICD-10-CM

## 2013-03-30 LAB — LIPID PANEL
Cholesterol: 304 mg/dL — ABNORMAL HIGH (ref 0–200)
HDL: 74.1 mg/dL (ref >39.00)
Triglycerides: 59 mg/dL (ref 0.0–149.0)

## 2013-03-30 LAB — LDL CHOLESTEROL, DIRECT: Direct LDL: 218.6 mg/dL

## 2013-03-30 NOTE — Progress Notes (Signed)
Subjective:    Patient ID: Sandra Tran, female    DOB: [birth year], 27 y.o.   MRN: [id number]  HPI Patient seen to establish care. Past medical history reviewed She recently delivered twins back in May. Complications during pregnancy of HELP syndrome and severe preeclampsia.  Her daughter is at home and son still in the NICU. She is pumping breast milk for breast-feeding. She was briefly on HCTZ for elevated blood pressure following delivery but not currently.  Other chronic problems include history of depression currently treated by psychiatrist with Cymbalta. She has history of reported heart murmur in childhood but none now. Depression is stable.  Long-term history of GERD. She takes protonix. Recent EGD last Thursday. Apparently no abnormalities. Patient reports questionable history of osteopenia around age 31. Takes calcium and vitamin D supplementation. Requesting repeat bone density scan.  History of elevated blood pressure only during pregnancy. None since. She has history of reported severe hyperlipidemia with cholesterol or 500 and triglycerides over 300. She was treated briefly with WelChol. No recent lipids.  History of interstitial cystitis. This is been followed by urology in the past. She takes Lyrica which helps. She has poorly defined muscular pain. No diagnosis of fibromyalgia but has benefited from Cymbalta and Lyrica.  Family history is significant for dyslipidemia hypertension and diabetes in both parents. Mom extends prediabetes. Maternal grandfather died of coronary disease age 52  Patient is married. 2 children which are twins as above. Nonsmoker. No alcohol use. She has degree in occupational therapy.   Past Medical History  Diagnosis Date  . Heart murmur   . Anxiety   . Fibromyalgia   . Blood dyscrasia   . GERD (gastroesophageal reflux disease)   . Breast disorder   . Anemia of pregnancy 02/12/2013  . Severe pre-eclampsia 02/13/2013   Past Surgical  History  Procedure Laterality Date  . Breast surgery      fibroadenomas 2004  . Cesarean section N/A 02/13/2013    Procedure: Primary cesrean section with delivery of twin A boy at 58.  Twin B girl at  1125.;  Surgeon: Lenoard Aden, MD;  Location: [facility];  Service: Obstetrics;  Laterality: N/A;    reports that she has never smoked. She has never used smokeless tobacco. She reports that she does not drink alcohol or use illicit drugs. family history includes Cancer in her mother; Depression in her mother; Diabetes in her father and mother; Hyperlipidemia in her father and mother; and Hypertension in her father and mother. Allergies  Allergen Reactions  . Oxycontin (Oxycodone) Nausea And Vomiting  . Percocet (Oxycodone-Acetaminophen) Nausea And Vomiting     Review of Systems  Constitutional: Negative for fever, chills, appetite change and unexpected weight change.  Respiratory: Negative for cough and shortness of breath.   Cardiovascular: Negative for chest pain, palpitations and leg swelling.  Gastrointestinal: Negative for abdominal pain.  Endocrine: Negative for cold intolerance, polydipsia and polyuria.  Genitourinary: Negative for dysuria.  Musculoskeletal: Positive for myalgias. Negative for arthralgias.  Skin: Negative for rash.  Neurological: Negative for dizziness.       Objective:   Physical Exam  Constitutional: She appears well-developed and well-nourished.  Neck: Neck supple. No thyromegaly present.  Cardiovascular: Normal rate and regular rhythm.   Pulmonary/Chest: Effort normal and breath sounds normal. No respiratory distress. She has no wheezes. She has no rales.  Musculoskeletal: She exhibits no edema.  Lymphadenopathy:    She has no cervical adenopathy.  Assessment & Plan:   #1 history of depression.  Stable and followed by psychiatry. #2 GERD stable.  Discussed lifestyle management.  Recent EGD as above. #3 chronic myalgias.  Have some  classic trigger points-?fibromyalgia.  Stable on Cymbalta and Lyrica. #4 Interstitial Cystitis followed by urology. #5 Dyslipidemia.  Severe per pt report in past.  Repeat lipids.

## 2013-03-30 NOTE — Patient Instructions (Addendum)
Hypertriglyceridemia  Diet for High blood levels of Triglycerides Most fats in food are triglycerides. Triglycerides in your blood are stored as fat in your body. High levels of triglycerides in your blood may put you at a greater risk for heart disease and stroke.  Normal triglyceride levels are less than 150 mg/dL. Borderline high levels are 150-199 mg/dl. High levels are 200 - 499 mg/dL, and very high triglyceride levels are greater than 500 mg/dL. The decision to treat high triglycerides is generally based on the level. For people with borderline or high triglyceride levels, treatment includes weight loss and exercise. Drugs are recommended for people with very high triglyceride levels. Many people who need treatment for high triglyceride levels have metabolic syndrome. This syndrome is a collection of disorders that often include: insulin resistance, high blood pressure, blood clotting problems, high cholesterol and triglycerides. TESTING PROCEDURE FOR TRIGLYCERIDES  You should not eat 4 hours before getting your triglycerides measured. The normal range of triglycerides is between 10 and 250 milligrams per deciliter (mg/dl). Some people may have extreme levels (1000 or above), but your triglyceride level may be too high if it is above 150 mg/dl, depending on what other risk factors you have for heart disease.  People with high blood triglycerides may also have high blood cholesterol levels. If you have high blood cholesterol as well as high blood triglycerides, your risk for heart disease is probably greater than if you only had high triglycerides. High blood cholesterol is one of the main risk factors for heart disease. CHANGING YOUR DIET  Your weight can affect your blood triglyceride level. If you are more than 20% above your ideal body weight, you may be able to lower your blood triglycerides by losing weight. Eating less and exercising regularly is the best way to combat this. Fat provides more  calories than any other food. The best way to lose weight is to eat less fat. Only 30% of your total calories should come from fat. Less than 7% of your diet should come from saturated fat. A diet low in fat and saturated fat is the same as a diet to decrease blood cholesterol. By eating a diet lower in fat, you may lose weight, lower your blood cholesterol, and lower your blood triglyceride level.  Eating a diet low in fat, especially saturated fat, may also help you lower your blood triglyceride level. Ask your dietitian to help you figure how much fat you can eat based on the number of calories your caregiver has prescribed for you.  Exercise, in addition to helping with weight loss may also help lower triglyceride levels.   Alcohol can increase blood triglycerides. You may need to stop drinking alcoholic beverages.  Too much carbohydrate in your diet may also increase your blood triglycerides. Some complex carbohydrates are necessary in your diet. These may include bread, rice, potatoes, other starchy vegetables and cereals.  Reduce "simple" carbohydrates. These may include pure sugars, candy, honey, and jelly without losing other nutrients. If you have the kind of high blood triglycerides that is affected by the amount of carbohydrates in your diet, you will need to eat less sugar and less high-sugar foods. Your caregiver can help you with this.  Adding 2-4 grams of fish oil (EPA+ DHA) may also help lower triglycerides. Speak with your caregiver before adding any supplements to your regimen. Following the Diet  Maintain your ideal weight. Your caregivers can help you with a diet. Generally, eating less food and getting more   exercise will help you lose weight. Joining a weight control group may also help. Ask your caregivers for a good weight control group in your area.  Eat low-fat foods instead of high-fat foods. This can help you lose weight too.  These foods are lower in fat. Eat MORE of these:    Dried beans, peas, and lentils.  Egg whites.  Low-fat cottage cheese.  Fish.  Lean cuts of meat, such as round, sirloin, rump, and flank (cut extra fat off meat you fix).  Whole grain breads, cereals and pasta.  Skim and nonfat dry milk.  Low-fat yogurt.  Poultry without the skin.  Cheese made with skim or part-skim milk, such as mozzarella, parmesan, farmers', ricotta, or pot cheese. These are higher fat foods. Eat LESS of these:   Whole milk and foods made from whole milk, such as American, blue, cheddar, monterey jack, and swiss cheese  High-fat meats, such as luncheon meats, sausages, knockwurst, bratwurst, hot dogs, ribs, corned beef, ground pork, and regular ground beef.  Fried foods. Limit saturated fats in your diet. Substituting unsaturated fat for saturated fat may decrease your blood triglyceride level. You will need to read package labels to know which products contain saturated fats.  These foods are high in saturated fat. Eat LESS of these:   Fried pork skins.  Whole milk.  Skin and fat from poultry.  Palm oil.  Butter.  Shortening.  Cream cheese.  Bacon.  Margarines and baked goods made from listed oils.  Vegetable shortenings.  Chitterlings.  Fat from meats.  Coconut oil.  Palm kernel oil.  Lard.  Cream.  Sour cream.  Fatback.  Coffee whiteners and non-dairy creamers made with these oils.  Cheese made from whole milk. Use unsaturated fats (both polyunsaturated and monounsaturated) moderately. Remember, even though unsaturated fats are better than saturated fats; you still want a diet low in total fat.  These foods are high in unsaturated fat:   Canola oil.  Sunflower oil.  Mayonnaise.  Almonds.  Peanuts.  Pine nuts.  Margarines made with these oils.  Safflower oil.  Olive oil.  Avocados.  Cashews.  Peanut butter.  Sunflower seeds.  Soybean oil.  Peanut  oil.  Olives.  Pecans.  Walnuts.  Pumpkin seeds. Avoid sugar and other high-sugar foods. This will decrease carbohydrates without decreasing other nutrients. Sugar in your food goes rapidly to your blood. When there is excess sugar in your blood, your liver may use it to make more triglycerides. Sugar also contains calories without other important nutrients.  Eat LESS of these:   Sugar, brown sugar, powdered sugar, jam, jelly, preserves, honey, syrup, molasses, pies, candy, cakes, cookies, frosting, pastries, colas, soft drinks, punches, fruit drinks, and regular gelatin.  Avoid alcohol. Alcohol, even more than sugar, may increase blood triglycerides. In addition, alcohol is high in calories and low in nutrients. Ask for sparkling water, or a diet soft drink instead of an alcoholic beverage. Suggestions for planning and preparing meals   Bake, broil, grill or roast meats instead of frying.  Remove fat from meats and skin from poultry before cooking.  Add spices, herbs, lemon juice or vinegar to vegetables instead of salt, rich sauces or gravies.  Use a non-stick skillet without fat or use no-stick sprays.  Cool and refrigerate stews and broth. Then remove the hardened fat floating on the surface before serving.  Refrigerate meat drippings and skim off fat to make low-fat gravies.  Serve more fish.  Use less butter,   margarine and other high-fat spreads on bread or vegetables.  Use skim or reconstituted non-fat dry milk for cooking.  Cook with low-fat cheeses.  Substitute low-fat yogurt or cottage cheese for all or part of the sour cream in recipes for sauces, dips or congealed salads.  Use half yogurt/half mayonnaise in salad recipes.  Substitute evaporated skim milk for cream. Evaporated skim milk or reconstituted non-fat dry milk can be whipped and substituted for whipped cream in certain recipes.  Choose fresh fruits for dessert instead of high-fat foods such as pies or  cakes. Fruits are naturally low in fat. When Dining Out   Order low-fat appetizers such as fruit or vegetable juice, pasta with vegetables or tomato sauce.  Select clear, rather than cream soups.  Ask that dressings and gravies be served on the side. Then use less of them.  Order foods that are baked, broiled, poached, steamed, stir-fried, or roasted.  Ask for margarine instead of butter, and use only a small amount.  Drink sparkling water, unsweetened tea or coffee, or diet soft drinks instead of alcohol or other sweet beverages. QUESTIONS AND ANSWERS ABOUT OTHER FATS IN THE BLOOD: SATURATED FAT, TRANS FAT, AND CHOLESTEROL What is trans fat? Trans fat is a type of fat that is formed when vegetable oil is hardened through a process called hydrogenation. This process helps makes foods more solid, gives them shape, and prolongs their shelf life. Trans fats are also called hydrogenated or partially hydrogenated oils.  What do saturated fat, trans fat, and cholesterol in foods have to do with heart disease? Saturated fat, trans fat, and cholesterol in the diet all raise the level of LDL "bad" cholesterol in the blood. The higher the LDL cholesterol, the greater the risk for coronary heart disease (CHD). Saturated fat and trans fat raise LDL similarly.  What foods contain saturated fat, trans fat, and cholesterol? High amounts of saturated fat are found in animal products, such as fatty cuts of meat, chicken skin, and full-fat dairy products like butter, whole milk, cream, and cheese, and in tropical vegetable oils such as palm, palm kernel, and coconut oil. Trans fat is found in some of the same foods as saturated fat, such as vegetable shortening, some margarines (especially hard or stick margarine), crackers, cookies, baked goods, fried foods, salad dressings, and other processed foods made with partially hydrogenated vegetable oils. Small amounts of trans fat also occur naturally in some animal  products, such as milk products, beef, and lamb. Foods high in cholesterol include liver, other organ meats, egg yolks, shrimp, and full-fat dairy products. How can I use the new food label to make heart-healthy food choices? Check the Nutrition Facts panel of the food label. Choose foods lower in saturated fat, trans fat, and cholesterol. For saturated fat and cholesterol, you can also use the Percent Daily Value (%DV): 5% DV or less is low, and 20% DV or more is high. (There is no %DV for trans fat.) Use the Nutrition Facts panel to choose foods low in saturated fat and cholesterol, and if the trans fat is not listed, read the ingredients and limit products that list shortening or hydrogenated or partially hydrogenated vegetable oil, which tend to be high in trans fat. POINTS TO REMEMBER:   Discuss your risk for heart disease with your caregivers, and take steps to reduce risk factors.  Change your diet. Choose foods that are low in saturated fat, trans fat, and cholesterol.  Add exercise to your daily routine if   it is not already being done. Participate in physical activity of moderate intensity, like brisk walking, for at least 30 minutes on most, and preferably all days of the week. No time? Break the 30 minutes into three, 10-minute segments during the day.  Stop smoking. If you do smoke, contact your caregiver to discuss ways in which they can help you quit.  Do not use street drugs.  Maintain a normal weight.  Maintain a healthy blood pressure.  Keep up with your blood work for checking the fats in your blood as directed by your caregiver. Document Released: 06/27/2004 Document Revised: 03/10/2012 Document Reviewed: 01/23/2009 [facility] Patient Information 2014 [place], [place]. Fat and Cholesterol Control Diet Cholesterol levels in your body are determined significantly by your diet. Cholesterol levels may also be related to heart disease. The following material helps to explain this  relationship and discusses what you can do to help keep your heart healthy. Not all cholesterol is bad. Low-density lipoprotein (LDL) cholesterol is the "bad" cholesterol. It may cause fatty deposits to build up inside your arteries. High-density lipoprotein (HDL) cholesterol is "good." It helps to remove the "bad" LDL cholesterol from your blood. Cholesterol is a very important risk factor for heart disease. Other risk factors are high blood pressure, smoking, stress, heredity, and weight. The heart muscle gets its supply of blood through the coronary arteries. If your LDL cholesterol is high and your HDL cholesterol is low, you are at risk for having fatty deposits build up in your coronary arteries. This leaves less room through which blood can flow. Without sufficient blood and oxygen, the heart muscle cannot function properly and you may feel chest pains (angina pectoris). When a coronary artery closes up entirely, a part of the heart muscle may die causing a heart attack (myocardial infarction). CHECKING CHOLESTEROL When your caregiver sends your blood to a lab to be examined for cholesterol, a complete lipid (fat) profile may be done. With this test, the total amount of cholesterol and levels of LDL and HDL are determined. Triglycerides are a type of fat that circulates in the blood. They can also be used to determine heart disease risk. The list below describes what the numbers should be: Test: Total Cholesterol.  Less than 200 mg/dl. Test: LDL "bad cholesterol."  Less than 100 mg/dl.  Less than 70 mg/dl if you are at very high risk of a heart attack or sudden cardiac death. Test: HDL "good cholesterol."  Greater than 50 mg/dl for women.  Greater than 40 mg/dl for men. Test: Triglycerides.  Less than 150 mg/dl. CONTROLLING CHOLESTEROL WITH DIET Although exercise and lifestyle factors are important, your diet is key. That is because certain foods are known to raise cholesterol and others  to lower it. The goal is to balance foods for their effect on cholesterol and more importantly, to replace saturated and trans fat with other types of fat, such as monounsaturated fat, polyunsaturated fat, and omega-3 fatty acids. On average, a person should consume no more than 15 to 17 g of saturated fat daily. Saturated and trans fats are considered "bad" fats, and they will raise LDL cholesterol. Saturated fats are primarily found in animal products such as meats, butter, and cream. However, that does not mean you need to give up all your favorite foods. Today, there are good tasting, low-fat, low-cholesterol substitutes for most of the things you like to eat. Choose low-fat or nonfat alternatives. Choose round or loin cuts of red meat. These types of  cuts are lowest in fat and cholesterol. Chicken (without the skin), fish, veal, and ground [place] breast are great choices. Eliminate fatty meats, such as hot dogs and salami. Even shellfish have little or no saturated fat. Have a 3 oz (85 g) portion when you eat lean meat, poultry, or fish. Trans fats are also called "partially hydrogenated oils." They are oils that have been scientifically manipulated so that they are solid at room temperature resulting in a longer shelf life and improved taste and texture of foods in which they are added. Trans fats are found in stick margarine, some tub margarines, cookies, crackers, and baked goods.  When baking and cooking, oils are a great substitute for butter. The monounsaturated oils are especially beneficial since it is believed they lower LDL and raise HDL. The oils you should avoid entirely are saturated tropical oils, such as coconut and palm.  Remember to eat a lot from food groups that are naturally free of saturated and trans fat, including fish, fruit, vegetables, beans, grains (barley, rice, couscous, bulgur wheat), and pasta (without cream sauces).  IDENTIFYING FOODS THAT LOWER CHOLESTEROL  Soluble fiber  may lower your cholesterol. This type of fiber is found in fruits such as apples, vegetables such as broccoli, potatoes, and carrots, legumes such as beans, peas, and lentils, and grains such as barley. Foods fortified with plant sterols (phytosterol) may also lower cholesterol. You should eat at least 2 g per day of these foods for a cholesterol lowering effect.  Read package labels to identify low-saturated fats, trans fat free, and low-fat foods at the supermarket. Select cheeses that have only 2 to 3 g saturated fat per ounce. Use a heart-healthy tub margarine that is free of trans fats or partially hydrogenated oil. When buying baked goods (cookies, crackers), avoid partially hydrogenated oils. Breads and muffins should be made from whole grains (whole-wheat or whole oat flour, instead of "flour" or "enriched flour"). Buy non-creamy canned soups with reduced salt and no added fats.  FOOD PREPARATION TECHNIQUES  Never deep-fry. If you must fry, either stir-fry, which uses very little fat, or use non-stick cooking sprays. When possible, broil, bake, or roast meats, and steam vegetables. Instead of putting butter or margarine on vegetables, use lemon and herbs, applesauce, and cinnamon (for squash and sweet potatoes), nonfat yogurt, salsa, and low-fat dressings for salads.  LOW-SATURATED FAT / LOW-FAT FOOD SUBSTITUTES Meats / Saturated Fat (g)  Avoid: Steak, marbled (3 oz/85 g) / 11 g  Choose: Steak, lean (3 oz/85 g) / 4 g  Avoid: Hamburger (3 oz/85 g) / 7 g  Choose: Hamburger, lean (3 oz/85 g) / 5 g  Avoid: Ham (3 oz/85 g) / 6 g  Choose: Ham, lean cut (3 oz/85 g) / 2.4 g  Avoid: Chicken, with skin, dark meat (3 oz/85 g) / 4 g  Choose: Chicken, skin removed, dark meat (3 oz/85 g) / 2 g  Avoid: Chicken, with skin, light meat (3 oz/85 g) / 2.5 g  Choose: Chicken, skin removed, light meat (3 oz/85 g) / 1 g Dairy / Saturated Fat (g)  Avoid: Whole milk (1 cup) / 5 g  Choose: Low-fat milk,  2% (1 cup) / 3 g  Choose: Low-fat milk, 1% (1 cup) / 1.5 g  Choose: Skim milk (1 cup) / 0.3 g  Avoid: Hard cheese (1 oz/28 g) / 6 g  Choose: Skim milk cheese (1 oz/28 g) / 2 to 3 g  Avoid: Cottage cheese, 4% fat (  1 cup) / 6.5 g  Choose: Low-fat cottage cheese, 1% fat (1 cup) / 1.5 g  Avoid: Ice cream (1 cup) / 9 g  Choose: Sherbet (1 cup) / 2.5 g  Choose: Nonfat frozen yogurt (1 cup) / 0.3 g  Choose: Frozen fruit bar / trace  Avoid: Whipped cream (1 tbs) / 3.5 g  Choose: Nondairy whipped topping (1 tbs) / 1 g Condiments / Saturated Fat (g)  Avoid: Mayonnaise (1 tbs) / 2 g  Choose: Low-fat mayonnaise (1 tbs) / 1 g  Avoid: Butter (1 tbs) / 7 g  Choose: Extra light margarine (1 tbs) / 1 g  Avoid: Coconut oil (1 tbs) / 11.8 g  Choose: Olive oil (1 tbs) / 1.8 g  Choose: Corn oil (1 tbs) / 1.7 g  Choose: Safflower oil (1 tbs) / 1.2 g  Choose: Sunflower oil (1 tbs) / 1.4 g  Choose: Soybean oil (1 tbs) / 2.4 g  Choose: Canola oil (1 tbs) / 1 g Document Released: 09/09/2005 Document Revised: 12/02/2011 Document Reviewed: 02/28/2011 [employer] Patient Information 2014 [place], [place].

## 2013-03-31 DIAGNOSIS — Z8659 Personal history of other mental and behavioral disorders: Secondary | ICD-10-CM | POA: Insufficient documentation

## 2013-03-31 DIAGNOSIS — N3010 Interstitial cystitis (chronic) without hematuria: Secondary | ICD-10-CM | POA: Insufficient documentation

## 2013-03-31 DIAGNOSIS — IMO0001 Reserved for inherently not codable concepts without codable children: Secondary | ICD-10-CM | POA: Insufficient documentation

## 2013-03-31 DIAGNOSIS — K219 Gastro-esophageal reflux disease without esophagitis: Secondary | ICD-10-CM | POA: Insufficient documentation

## 2013-04-01 ENCOUNTER — Other Ambulatory Visit: Payer: Self-pay | Admitting: Family Medicine

## 2013-04-01 ENCOUNTER — Other Ambulatory Visit: Payer: BC Managed Care – PPO

## 2013-04-01 ENCOUNTER — Ambulatory Visit (INDEPENDENT_AMBULATORY_CARE_PROVIDER_SITE_OTHER)
Admission: RE | Admit: 2013-04-01 | Discharge: 2013-04-01 | Disposition: A | Discharge status: Home / Self Care | Payer: BC Managed Care – PPO | Source: Ambulatory Visit | Attending: Family Medicine | Admitting: Family Medicine

## 2013-04-01 DIAGNOSIS — Z8739 Personal history of other diseases of the musculoskeletal system and connective tissue: Secondary | ICD-10-CM

## 2013-04-11 ENCOUNTER — Encounter: Payer: Self-pay | Admitting: Family Medicine

## 2013-04-12 ENCOUNTER — Encounter: Payer: Self-pay | Admitting: Family Medicine

## 2013-04-21 ENCOUNTER — Other Ambulatory Visit: Payer: Self-pay

## 2013-05-25 ENCOUNTER — Telehealth: Payer: Self-pay | Admitting: Family Medicine

## 2013-05-25 NOTE — Telephone Encounter (Signed)
Last visit 03/30/2013

## 2013-05-25 NOTE — Telephone Encounter (Addendum)
Pt request refill ofpregabalin (LYRICA) 100 MG capsule w/ refills.  [facility]/ [address] [place] [address] / [place], [place]  [phone]  Store no. [id number] Pt is back and forth from [employer] and [employer]

## 2013-05-25 NOTE — Telephone Encounter (Signed)
May refill for 6 months 

## 2013-05-26 MED ORDER — PREGABALIN 100 MG PO CAPS: 100.0000 mg | ORAL_CAPSULE | Freq: Every day | ORAL | Status: DC

## 2013-05-26 NOTE — Telephone Encounter (Signed)
Faxed rx to pharmacy  

## 2013-07-07 ENCOUNTER — Encounter: Payer: Self-pay | Admitting: Family Medicine

## 2013-07-07 ENCOUNTER — Ambulatory Visit (INDEPENDENT_AMBULATORY_CARE_PROVIDER_SITE_OTHER): Payer: BC Managed Care – PPO | Admitting: Family Medicine

## 2013-07-07 VITALS — BP 126/68 | HR 78 | Wt 174.0 lb

## 2013-07-07 DIAGNOSIS — E785 Hyperlipidemia, unspecified: Secondary | ICD-10-CM

## 2013-07-07 DIAGNOSIS — R21 Rash and other nonspecific skin eruption: Secondary | ICD-10-CM

## 2013-07-07 DIAGNOSIS — K219 Gastro-esophageal reflux disease without esophagitis: Secondary | ICD-10-CM

## 2013-07-07 DIAGNOSIS — N3010 Interstitial cystitis (chronic) without hematuria: Secondary | ICD-10-CM

## 2013-07-07 DIAGNOSIS — Z8619 Personal history of other infectious and parasitic diseases: Secondary | ICD-10-CM

## 2013-07-07 MED ORDER — VALACYCLOVIR HCL 1 G PO TABS: 1000.0000 mg | ORAL_TABLET | Freq: Two times a day (BID) | ORAL | Status: DC

## 2013-07-07 MED ORDER — TRAZODONE HCL 50 MG PO TABS: 50.0000 mg | ORAL_TABLET | Freq: Every day | ORAL | Status: AC

## 2013-07-07 MED ORDER — HYDROCODONE-ACETAMINOPHEN 10-325 MG PO TABS: 1.0000 | ORAL_TABLET | Freq: Four times a day (QID) | ORAL | Status: AC | PRN

## 2013-07-07 MED ORDER — DULOXETINE HCL 60 MG PO CPEP: 60.0000 mg | ORAL_CAPSULE | Freq: Every day | ORAL | Status: AC

## 2013-07-07 MED ORDER — ROSUVASTATIN CALCIUM 10 MG PO TABS: 10.0000 mg | ORAL_TABLET | Freq: Every day | ORAL | Status: DC

## 2013-07-07 MED ORDER — PREGABALIN 100 MG PO CAPS: 100.0000 mg | ORAL_CAPSULE | Freq: Every day | ORAL | Status: DC

## 2013-07-07 MED ORDER — KETOCONAZOLE 2 % EX CREA: TOPICAL_CREAM | Freq: Every day | CUTANEOUS | Status: AC

## 2013-07-07 MED ORDER — PANTOPRAZOLE SODIUM 40 MG PO TBEC: 40.0000 mg | DELAYED_RELEASE_TABLET | Freq: Two times a day (BID) | ORAL | Status: AC

## 2013-07-07 NOTE — Progress Notes (Signed)
Subjective:    Patient ID: Sandra Tran, female    DOB: 1986/03/28, 28 y.o.   MRN: [id number]  HPI Patient here for followup of multiple issues:  Long history of interstitial cystitis. Previously managed with regimen of Cymbalta, Lyrica, trazodone. She was recently breast-feeding her twins but has discontinued for good. For that reason, she would like to go back on her usual medications. She is definitive that she'll not return to any breast-feeding. Her husband recently had a sterilization procedure with vasectomy. She is also requesting limited Norco 10 mg which she uses only rarely for breakthrough interstitial cystitis pain  She has previously been followed by urologist.  Second issue is extremely elevated lipids. Recent cholesterol over 300 with LDL over 200. We discussed possible trial of low-dose statin therapy after she finished breast-feeding.  She previously took [employer] which did not seem to help.  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 History of recurrent pruritic scaly rash on feet. Previously has used ketoconazole 2% cream which helped. Requesting refills.  Past Medical History  Diagnosis Date  . Heart murmur   . Anxiety   . Fibromyalgia   . Blood dyscrasia   . GERD (gastroesophageal reflux disease)   . Breast disorder   . Anemia of pregnancy 02/12/2013  . Severe pre-eclampsia 02/13/2013   Past Surgical History  Procedure Laterality Date  . Breast surgery      fibroadenomas 2004  . Cesarean section N/A 02/13/2013    Procedure: Primary cesrean section with delivery of twin A boy at 49.  Twin B girl at  1125.;  Surgeon: Lenoard Aden, MD;  Location: [facility];  Service: Obstetrics;  Laterality: N/A;    reports that she has never smoked. She has never used smokeless tobacco. She reports that she does not drink alcohol or use illicit drugs. family history includes Cancer in her mother; Depression in her mother; Diabetes in her father and  mother; Hyperlipidemia in her father and mother; Hypertension in her father and mother. Allergies  Allergen Reactions  . Oxycontin [Oxycodone] Nausea And Vomiting  . Percocet [Oxycodone-Acetaminophen] Nausea And Vomiting      Review of Systems  Constitutional: Negative for fever, chills, appetite change and unexpected weight change.  Cardiovascular: Negative for chest pain.  Gastrointestinal: Negative for nausea, vomiting and abdominal pain.  Genitourinary: Negative for hematuria, flank pain and difficulty urinating.  Skin: Positive for rash.  Neurological: Negative for dizziness.       Objective:   Physical Exam  Constitutional: She appears well-developed and well-nourished.  Neck: Neck supple. No thyromegaly present.  Cardiovascular: Normal rate and regular rhythm.   Pulmonary/Chest: Effort normal and breath sounds normal. No respiratory distress. She has no wheezes. She has no rales.  Musculoskeletal: She exhibits no edema.  Skin: Rash noted.  Feet reveal somewhat nonspecific slightly scaly rash bottoms of both feet. No interdigital involvement. No pustules. No vesicles.          Assessment & Plan:  #1 history of interstitial cystitis. We refilled her usual medications which have helped with chronic management including Cymbalta, Lyrica, trazodone. We wrote for limited Norco 10 mg for breakthrough pain which she uses rarely #2 hyperlipidemia. Crestor 10 mg once daily. Review possible side effects. Repeat lipid and hepatic in 2 months #3 history of recurrent cold sores. Valtrex 1 g 2 tablets At onset and repeat 2 tablets in 12 hours #4 skin rash involving feet. Previous response to ketoconazole cream and this is refilled

## 2013-07-29 ENCOUNTER — Other Ambulatory Visit: Payer: Self-pay

## 2013-09-06 ENCOUNTER — Ambulatory Visit: Payer: BC Managed Care – PPO | Admitting: Family Medicine

## 2013-09-20 ENCOUNTER — Ambulatory Visit (INDEPENDENT_AMBULATORY_CARE_PROVIDER_SITE_OTHER): Payer: BC Managed Care – PPO | Admitting: Family Medicine

## 2013-09-20 ENCOUNTER — Encounter: Payer: Self-pay | Admitting: Family Medicine

## 2013-09-20 VITALS — BP 120/70 | HR 83 | Temp 97.9°F | Wt 172.0 lb

## 2013-09-20 DIAGNOSIS — E785 Hyperlipidemia, unspecified: Secondary | ICD-10-CM

## 2013-09-20 LAB — HEPATIC FUNCTION PANEL
ALT: 11 U/L (ref 0–35)
AST: 17 U/L (ref 0–37)
Albumin: 4.7 g/dL (ref 3.5–5.2)
Alkaline Phosphatase: 74 U/L (ref 39–117)
Bilirubin, Direct: 0.1 mg/dL (ref 0.0–0.3)
Total Protein: 7.3 g/dL (ref 6.0–8.3)

## 2013-09-20 LAB — LIPID PANEL
HDL: 43.9 mg/dL (ref >39.00)
Total CHOL/HDL Ratio: 4

## 2013-09-20 NOTE — Progress Notes (Signed)
Pre visit review using our clinic review tool, if applicable. No additional management support is needed unless otherwise documented below in the visit note. 

## 2013-09-20 NOTE — Patient Instructions (Signed)
Fat and Cholesterol Control Diet  Fat and cholesterol levels in your blood and organs are influenced by your diet. High levels of fat and cholesterol may lead to diseases of the heart, small and large blood vessels, gallbladder, liver, and pancreas.  CONTROLLING FAT AND CHOLESTEROL WITH DIET  Although exercise and lifestyle factors are important, your diet is key. That is because certain foods are known to raise cholesterol and others to lower it. The goal is to balance foods for their effect on cholesterol and more importantly, to replace saturated and trans fat with other types of fat, such as monounsaturated fat, polyunsaturated fat, and omega-3 fatty acids.  On average, a person should consume no more than 15 to 17 g of saturated fat daily. Saturated and trans fats are considered "bad" fats, and they will raise LDL cholesterol. Saturated fats are primarily found in animal products such as meats, butter, and cream. However, that does not mean you need to give up all your favorite foods. Today, there are good tasting, low-fat, low-cholesterol substitutes for most of the things you like to eat. Choose low-fat or nonfat alternatives. Choose round or loin cuts of red meat. These types of cuts are lowest in fat and cholesterol. Chicken (without the skin), fish, veal, and ground turkey breast are great choices. Eliminate fatty meats, such as hot dogs and salami. Even shellfish have little or no saturated fat. Have a 3 oz (85 g) portion when you eat lean meat, poultry, or fish.  Trans fats are also called "partially hydrogenated oils." They are oils that have been scientifically manipulated so that they are solid at room temperature resulting in a longer shelf life and improved taste and texture of foods in which they are added. Trans fats are found in stick margarine, some tub margarines, cookies, crackers, and baked goods.   When baking and cooking, oils are a great substitute for butter. The monounsaturated oils are  especially beneficial since it is believed they lower LDL and raise HDL. The oils you should avoid entirely are saturated tropical oils, such as coconut and palm.   Remember to eat a lot from food groups that are naturally free of saturated and trans fat, including fish, fruit, vegetables, beans, grains (barley, rice, couscous, bulgur wheat), and pasta (without cream sauces).   IDENTIFYING FOODS THAT LOWER FAT AND CHOLESTEROL   Soluble fiber may lower your cholesterol. This type of fiber is found in fruits such as apples, vegetables such as broccoli, potatoes, and carrots, legumes such as beans, peas, and lentils, and grains such as barley. Foods fortified with plant sterols (phytosterol) may also lower cholesterol. You should eat at least 2 g per day of these foods for a cholesterol lowering effect.   Read package labels to identify low-saturated fats, trans fat free, and low-fat foods at the supermarket. Select cheeses that have only 2 to 3 g saturated fat per ounce. Use a heart-healthy tub margarine that is free of trans fats or partially hydrogenated oil. When buying baked goods (cookies, crackers), avoid partially hydrogenated oils. Breads and muffins should be made from whole grains (whole-wheat or whole oat flour, instead of "flour" or "enriched flour"). Buy non-creamy canned soups with reduced salt and no added fats.   FOOD PREPARATION TECHNIQUES   Never deep-fry. If you must fry, either stir-fry, which uses very little fat, or use non-stick cooking sprays. When possible, broil, bake, or roast meats, and steam vegetables. Instead of putting butter or margarine on vegetables, use lemon   and herbs, applesauce, and cinnamon (for squash and sweet potatoes). Use nonfat yogurt, salsa, and low-fat dressings for salads.   LOW-SATURATED FAT / LOW-FAT FOOD SUBSTITUTES  Meats / Saturated Fat (g)  · Avoid: Steak, marbled (3 oz/85 g) / 11 g  · Choose: Steak, lean (3 oz/85 g) / 4 g  · Avoid: Hamburger (3 oz/85 g) / 7  g  · Choose: Hamburger, lean (3 oz/85 g) / 5 g  · Avoid: Ham (3 oz/85 g) / 6 g  · Choose: Ham, lean cut (3 oz/85 g) / 2.4 g  · Avoid: Chicken, with skin, dark meat (3 oz/85 g) / 4 g  · Choose: Chicken, skin removed, dark meat (3 oz/85 g) / 2 g  · Avoid: Chicken, with skin, light meat (3 oz/85 g) / 2.5 g  · Choose: Chicken, skin removed, light meat (3 oz/85 g) / 1 g  Dairy / Saturated Fat (g)  · Avoid: Whole milk (1 cup) / 5 g  · Choose: Low-fat milk, 2% (1 cup) / 3 g  · Choose: Low-fat milk, 1% (1 cup) / 1.5 g  · Choose: Skim milk (1 cup) / 0.3 g  · Avoid: Hard cheese (1 oz/28 g) / 6 g  · Choose: Skim milk cheese (1 oz/28 g) / 2 to 3 g  · Avoid: Cottage cheese, 4% fat (1 cup) / 6.5 g  · Choose: Low-fat cottage cheese, 1% fat (1 cup) / 1.5 g  · Avoid: Ice cream (1 cup) / 9 g  · Choose: Sherbet (1 cup) / 2.5 g  · Choose: Nonfat frozen yogurt (1 cup) / 0.3 g  · Choose: Frozen fruit bar / trace  · Avoid: Whipped cream (1 tbs) / 3.5 g  · Choose: Nondairy whipped topping (1 tbs) / 1 g  Condiments / Saturated Fat (g)  · Avoid: Mayonnaise (1 tbs) / 2 g  · Choose: Low-fat mayonnaise (1 tbs) / 1 g  · Avoid: Butter (1 tbs) / 7 g  · Choose: Extra light margarine (1 tbs) / 1 g  · Avoid: Coconut oil (1 tbs) / 11.8 g  · Choose: Olive oil (1 tbs) / 1.8 g  · Choose: Corn oil (1 tbs) / 1.7 g  · Choose: Safflower oil (1 tbs) / 1.2 g  · Choose: Sunflower oil (1 tbs) / 1.4 g  · Choose: Soybean oil (1 tbs) / 2.4 g  · Choose: Canola oil (1 tbs) / 1 g  Document Released: 09/09/2005 Document Revised: 01/04/2013 Document Reviewed: 02/28/2011  [employer]® Patient Information ©2014 [employer].

## 2013-09-20 NOTE — Progress Notes (Signed)
   Subjective:    Patient ID: Sandra Tran, female    DOB: Apr 16, 1986, 27 y.o.   MRN: [id number]  HPI Patient is here for medical follow up She has history of interstitial cystitis is on multiple medications to help control that. Her symptoms are stable. She still has intermittent flareups. Rarely takes hydrocodone for severe pain  Hyperlipidemia. Both parents and had cardiac issues and mother has type 2 diabetes. Patient started Crestor 10 mg daily 2 months ago. 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 No myalgias. No side effects. Needs repeat lab work today. She tends to follow low cholesterol diet  Past Medical History  Diagnosis Date  . Heart murmur   . Anxiety   . Fibromyalgia   . Blood dyscrasia   . GERD (gastroesophageal reflux disease)   . Breast disorder   . Anemia of pregnancy 02/12/2013  . Severe pre-eclampsia 02/13/2013   Past Surgical History  Procedure Laterality Date  . Breast surgery      fibroadenomas 2004  . Cesarean section N/A 02/13/2013    Procedure: Primary cesrean section with delivery of twin A boy at 84.  Twin B girl at  1125.;  Surgeon: Lenoard Aden, MD;  Location: [facility];  Service: Obstetrics;  Laterality: N/A;    reports that she has never smoked. She has never used smokeless tobacco. She reports that she does not drink alcohol or use illicit drugs. family history includes Cancer in her mother; Depression in her mother; Diabetes in her father and mother; Hyperlipidemia in her father and mother; Hypertension in her father and mother. Allergies  Allergen Reactions  . Oxycontin [Oxycodone] Nausea And Vomiting  . Percocet [Oxycodone-Acetaminophen] Nausea And Vomiting      Review of Systems  Constitutional: Negative for fatigue.  Eyes: Negative for visual disturbance.  Respiratory: Negative for cough, chest tightness, shortness of breath and wheezing.   Cardiovascular: Negative for chest pain, palpitations and leg  swelling.  Endocrine: Negative for polydipsia and polyuria.  Neurological: Negative for dizziness, seizures, syncope, weakness, light-headedness and headaches.       Objective:   Physical Exam  Constitutional: She appears well-developed and well-nourished.  Neck: Neck supple. No thyromegaly present.  Cardiovascular: Normal rate.   Pulmonary/Chest: Effort normal and breath sounds normal. No respiratory distress. She has no wheezes. She has no rales.          Assessment & Plan:  Dyslipidemia. Repeat lipid and hepatic panel. Follow cholesterol control diet with handout given

## 2013-11-25 ENCOUNTER — Other Ambulatory Visit: Payer: Self-pay | Admitting: Family Medicine

## 2013-11-25 NOTE — Telephone Encounter (Signed)
Refill for 6 months. 

## 2013-11-25 NOTE — Telephone Encounter (Signed)
Last visit 09/20/13 Last refill 07/07/13 #30 5 refills

## 2014-03-29 ENCOUNTER — Telehealth: Payer: Self-pay | Admitting: Family Medicine

## 2014-03-29 NOTE — Telephone Encounter (Signed)
I called to submit a PA for pantoprazole 40 mg. I was advised pt must try omeprazole for 6 weeks within the last 120 days and fail.

## 2014-03-30 MED ORDER — ROSUVASTATIN CALCIUM 10 MG PO TABS: 10.0000 mg | ORAL_TABLET | Freq: Every day | ORAL | Status: AC

## 2014-03-30 NOTE — Telephone Encounter (Signed)
Let patient know. Omeprazole 40 mg once daily

## 2014-03-30 NOTE — Telephone Encounter (Signed)
Patient wants does not want to change Rx right now. Pt is taking OTC Nexium and it seems to be helping.

## 2014-03-31 ENCOUNTER — Telehealth: Payer: Self-pay | Admitting: Family Medicine

## 2014-03-31 NOTE — Telephone Encounter (Signed)
lmom for pt to cb

## 2014-03-31 NOTE — Telephone Encounter (Signed)
Pt is Sandra Tran ,[place] and requesting blood work. Pt is experiencing fatigue. Pt would like order fax to [facility] [phone] and phone #[phone]

## 2014-03-31 NOTE — Telephone Encounter (Signed)
Pt needs to be evaluated.  We cannot order labs for "fatigue" without more guided evaluation.  Fatigue can represent so many things.

## 2014-04-06 NOTE — Telephone Encounter (Signed)
lmom for pt to call back

## 2014-04-11 NOTE — Telephone Encounter (Signed)
lmom for pt to call back

## 2014-07-25 ENCOUNTER — Encounter: Payer: Self-pay | Admitting: Family Medicine

## 2014-08-11 ENCOUNTER — Other Ambulatory Visit: Payer: Self-pay | Admitting: Family Medicine

## 2017-06-12 ENCOUNTER — Encounter: Payer: Self-pay | Admitting: Family Medicine
